# Patient Record
Sex: Male | Born: 1995 | Race: Black or African American | Hispanic: No | Marital: Single | Smoking: Former smoker
Health system: Southern US, Community
[De-identification: ages and names within clinical notes are randomized; demographics above are authoritative.]

## PROBLEM LIST (undated history)

## (undated) DIAGNOSIS — Z973 Presence of spectacles and contact lenses: Secondary | ICD-10-CM

## (undated) DIAGNOSIS — Z8679 Personal history of other diseases of the circulatory system: Secondary | ICD-10-CM

## (undated) DIAGNOSIS — F32A Depression, unspecified: Secondary | ICD-10-CM

## (undated) DIAGNOSIS — F909 Attention-deficit hyperactivity disorder, unspecified type: Secondary | ICD-10-CM

## (undated) DIAGNOSIS — F419 Anxiety disorder, unspecified: Secondary | ICD-10-CM

## (undated) HISTORY — DX: Presence of spectacles and contact lenses: Z97.3

## (undated) HISTORY — DX: Depression, unspecified: F32.A

## (undated) HISTORY — PX: TONSILLECTOMY: SUR1361

## (undated) HISTORY — PX: NO PAST SURGERIES: SHX2092

## (undated) HISTORY — DX: Anxiety disorder, unspecified: F41.9

---
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
## 2015-01-24 ENCOUNTER — Other Ambulatory Visit (INDEPENDENT_AMBULATORY_CARE_PROVIDER_SITE_OTHER): Payer: BLUE CROSS/BLUE SHIELD

## 2015-01-24 DIAGNOSIS — Z23 Encounter for immunization: Secondary | ICD-10-CM | POA: Diagnosis not present

## 2015-03-15 ENCOUNTER — Other Ambulatory Visit: Payer: Self-pay | Admitting: Orthopedic Surgery

## 2015-03-15 DIAGNOSIS — M25531 Pain in right wrist: Secondary | ICD-10-CM

## 2015-03-23 ENCOUNTER — Ambulatory Visit
Admission: RE | Admit: 2015-03-23 | Discharge: 2015-03-23 | Disposition: A | Discharge status: Home / Self Care | Payer: BLUE CROSS/BLUE SHIELD | Source: Ambulatory Visit | Attending: Orthopedic Surgery | Admitting: Orthopedic Surgery

## 2015-03-23 DIAGNOSIS — M25531 Pain in right wrist: Secondary | ICD-10-CM

## 2015-04-21 ENCOUNTER — Ambulatory Visit (HOSPITAL_COMMUNITY)
Admission: EM | Admit: 2015-04-21 | Discharge: 2015-04-21 | Disposition: A | Discharge status: Home / Self Care | Payer: BLUE CROSS/BLUE SHIELD | Attending: Family Medicine | Admitting: Family Medicine

## 2015-04-21 ENCOUNTER — Encounter (HOSPITAL_COMMUNITY): Payer: Self-pay | Admitting: *Deleted

## 2015-04-21 DIAGNOSIS — J302 Other seasonal allergic rhinitis: Secondary | ICD-10-CM

## 2015-04-21 DIAGNOSIS — R040 Epistaxis: Secondary | ICD-10-CM

## 2015-04-21 MED ORDER — IPRATROPIUM BROMIDE 0.06 % NA SOLN: 2.0000 | Freq: Four times a day (QID) | NASAL | Status: DC

## 2015-04-21 MED ORDER — CETIRIZINE HCL 10 MG PO TABS: 10.0000 mg | ORAL_TABLET | Freq: Every day | ORAL | Status: DC

## 2015-04-21 NOTE — ED Notes (Signed)
Pt  Reports    He     Had   A  Nosebleed  About  1  Week  Ago     bleeding  Subsided       Bled  About  2  Hours  Ago  None  Now

## 2015-04-21 NOTE — ED Provider Notes (Signed)
CSN: [id number]     Arrival date & time 04/21/15  1429 History   First MD Initiated Contact with Patient 04/21/15 1507     Chief Complaint  Patient presents with  . Epistaxis   (Consider location/radiation/quality/duration/timing/severity/associated sxs/prior Treatment) Patient is a 20 y.o. male presenting with nosebleeds. The history is provided by the patient.  Epistaxis Location:  L nare Severity:  Mild Duration:  1 week Timing:  Sporadic Progression:  Improving Chronicity:  New Context: weather change   Relieved by:  None tried Worsened by:  Nothing tried Ineffective treatments:  None tried (stopped spontan.) Associated symptoms: congestion   Associated symptoms: no fever   Risk factors: allergies     Past Medical History  Diagnosis Date  . ADHD (attention deficit hyperactivity disorder)   . Wears contact lenses    Past Surgical History  Procedure Laterality Date  . Tonsillectomy    . Adenoidectomy     Family History  Problem Relation Age of Onset  . Cancer Maternal Grandmother     breast  . Heart disease Maternal Grandfather   . Stroke Neg Hx   . Diabetes Other     maternal side   Social History  Substance Use Topics  . Smoking status: Current Some Day Smoker -- 0.25 packs/day for .5 years  . Smokeless tobacco: None     Comment: 1-2 weekly  . Alcohol Use: No    Review of Systems  Constitutional: Negative.  Negative for fever.  HENT: Positive for congestion, nosebleeds and postnasal drip.   Respiratory: Negative.   All other systems reviewed and are negative.   Allergies  Review of patient's allergies indicates no known allergies.  Home Medications   Prior to Admission medications   Not on File   Meds Ordered and Administered this Visit  Medications - No data to display  BP 125/82 mmHg  Pulse 61  Temp(Src) 97.7 F (36.5 C) (Oral)  Resp 16  SpO2 98% No data found.   Physical Exam  Constitutional: He is oriented to person, place, and  time. He appears well-developed and well-nourished. No distress.  HENT:  Nose: Nose normal. No mucosal edema or rhinorrhea. No epistaxis.  Mouth/Throat: Oropharynx is clear and moist.  Neck: Normal range of motion. Neck supple.  Pulmonary/Chest: Effort normal and breath sounds normal.  Lymphadenopathy:    He has no cervical adenopathy.  Neurological: He is alert and oriented to person, place, and time.  Skin: Skin is warm and dry.  Nursing note and vitals reviewed.   ED Course  Procedures (including critical care time)  Labs Review Labs Reviewed - No data to display  Imaging Review No results found.   Visual Acuity Review  Right Eye Distance:   Left Eye Distance:   Bilateral Distance:    Right Eye Near:   Left Eye Near:    Bilateral Near:         MDM  No diagnosis found.  Meds ordered this encounter  Medications  . ipratropium (ATROVENT) 0.06 % nasal spray    Sig: Place 2 sprays into both nostrils 4 (four) times daily.    Dispense:  15 mL    Refill:  1  . cetirizine (ZYRTEC) 10 MG tablet    Sig: Take 1 tablet (10 mg total) by mouth daily. One tab daily for allergies    Dispense:  30 tablet    Refill:  1      Linna HoffJames D Lorene Klimas, MD 04/21/15 1526

## 2015-04-22 ENCOUNTER — Emergency Department (HOSPITAL_COMMUNITY)
Admission: EM | Admit: 2015-04-22 | Discharge: 2015-04-22 | Disposition: A | Discharge status: Home / Self Care | Payer: BLUE CROSS/BLUE SHIELD | Attending: Emergency Medicine | Admitting: Emergency Medicine

## 2015-04-22 ENCOUNTER — Encounter (HOSPITAL_COMMUNITY): Payer: Self-pay | Admitting: *Deleted

## 2015-04-22 DIAGNOSIS — F17200 Nicotine dependence, unspecified, uncomplicated: Secondary | ICD-10-CM | POA: Insufficient documentation

## 2015-04-22 DIAGNOSIS — Z973 Presence of spectacles and contact lenses: Secondary | ICD-10-CM | POA: Insufficient documentation

## 2015-04-22 DIAGNOSIS — R112 Nausea with vomiting, unspecified: Secondary | ICD-10-CM | POA: Insufficient documentation

## 2015-04-22 DIAGNOSIS — F419 Anxiety disorder, unspecified: Secondary | ICD-10-CM | POA: Diagnosis not present

## 2015-04-22 LAB — CBG MONITORING, ED: Glucose-Capillary: 105 mg/dL — ABNORMAL HIGH (ref 65–99)

## 2015-04-22 LAB — CBC WITH DIFFERENTIAL/PLATELET
BASOS ABS: 0 10*3/uL (ref 0.0–0.1)
BASOS PCT: 0 %
EOS ABS: 0.1 10*3/uL (ref 0.0–0.7)
EOS PCT: 1 %
HEMATOCRIT: 46.2 % (ref 39.0–52.0)
Hemoglobin: 16 g/dL (ref 13.0–17.0)
LYMPHS ABS: 0.5 10*3/uL — AB (ref 0.7–4.0)
LYMPHS PCT: 6 %
MCH: 28.4 pg (ref 26.0–34.0)
MCHC: 34.6 g/dL (ref 30.0–36.0)
MCV: 82.1 fL (ref 78.0–100.0)
MONO ABS: 0.8 10*3/uL (ref 0.1–1.0)
MONOS PCT: 9 %
NEUTROS ABS: 6.9 10*3/uL (ref 1.7–7.7)
NEUTROS PCT: 84 %
PLATELETS: 242 10*3/uL (ref 150–400)
RBC: 5.63 MIL/uL (ref 4.22–5.81)
RDW: 12.2 % (ref 11.5–15.5)
WBC: 8.2 10*3/uL (ref 4.0–10.5)

## 2015-04-22 LAB — COMPREHENSIVE METABOLIC PANEL
ALBUMIN: 4.8 g/dL (ref 3.5–5.0)
ALT: 22 U/L (ref 17–63)
AST: 23 U/L (ref 15–41)
Alkaline Phosphatase: 75 U/L (ref 38–126)
Anion gap: 10 (ref 5–15)
BILIRUBIN TOTAL: 0.9 mg/dL (ref 0.3–1.2)
BUN: 18 mg/dL (ref 6–20)
CHLORIDE: 108 mmol/L (ref 101–111)
CO2: 22 mmol/L (ref 22–32)
Calcium: 10.2 mg/dL (ref 8.9–10.3)
Creatinine, Ser: 1.08 mg/dL (ref 0.61–1.24)
GFR calc Af Amer: >60 mL/min (ref >60)
GFR calc non Af Amer: >60 mL/min (ref >60)
GLUCOSE: 129 mg/dL — AB (ref 65–99)
POTASSIUM: 3.7 mmol/L (ref 3.5–5.1)
SODIUM: 140 mmol/L (ref 135–145)
TOTAL PROTEIN: 8.1 g/dL (ref 6.5–8.1)

## 2015-04-22 LAB — RAPID URINE DRUG SCREEN, HOSP PERFORMED
Amphetamines: NOT DETECTED
Barbiturates: NOT DETECTED
Benzodiazepines: NOT DETECTED
Cocaine: NOT DETECTED
Opiates: NOT DETECTED
Tetrahydrocannabinol: NOT DETECTED

## 2015-04-22 LAB — LIPASE, BLOOD: Lipase: 28 U/L (ref 11–51)

## 2015-04-22 MED ORDER — ONDANSETRON 4 MG PO TBDP: 4.0000 mg | ORAL_TABLET | Freq: Three times a day (TID) | ORAL | Status: DC | PRN

## 2015-04-22 MED ORDER — METOCLOPRAMIDE HCL 5 MG/ML IJ SOLN
10.0000 mg | Freq: Once | INTRAMUSCULAR | Status: AC
  Administered 2015-04-22: 10 mg via INTRAVENOUS
  Filled 2015-04-22: qty 2

## 2015-04-22 MED ORDER — SODIUM CHLORIDE 0.9 % IV BOLUS (SEPSIS)
1000.0000 mL | Freq: Once | INTRAVENOUS | Status: AC
  Administered 2015-04-22: 1000 mL via INTRAVENOUS

## 2015-04-22 MED ORDER — LORAZEPAM 2 MG/ML IJ SOLN
0.5000 mg | Freq: Once | INTRAMUSCULAR | Status: AC
  Administered 2015-04-22: 0.5 mg via INTRAVENOUS
  Filled 2015-04-22: qty 1

## 2015-04-22 NOTE — ED Notes (Signed)
Bed: WA03 Expected date:  Expected time:  Means of arrival:  Comments: EMS 

## 2015-04-22 NOTE — ED Notes (Signed)
Per EMS pt coming from home with c/o nausea and vomiting x 2 days, denies pain, fever, diarrhea. EMS reports 2 emesis episodes en route, given 4 mg zofran IM.

## 2015-04-22 NOTE — ED Notes (Signed)
Pt sleeping but made family aware that we need a urine sample from him when pt wakes up.

## 2015-04-22 NOTE — ED Provider Notes (Signed)
CSN: [id number]     Arrival date & time 04/22/15  1041 History   First MD Initiated Contact with Patient 04/22/15 1049     Chief Complaint  Patient presents with  . Nausea  . Emesis     (Consider location/radiation/quality/duration/timing/severity/associated sxs/prior Treatment) HPI Comments: 20 year old male who presents with nausea and vomiting. Patient reports that he began vomiting early this morning and has had multiple episodes since then. He denies any abdominal pain, fevers, cough/cold symptoms, or diarrhea. He was vomiting with EMS and was given 4 mg IM  Zofran. He denies any sick contacts. He denies alcohol, marijuana, or other drug use.  Patient is a 20 y.o. male presenting with vomiting. The history is provided by the patient.  Emesis   Past Medical History  Diagnosis Date  . ADHD (attention deficit hyperactivity disorder)   . Wears contact lenses    Past Surgical History  Procedure Laterality Date  . Tonsillectomy    . Adenoidectomy     Family History  Problem Relation Age of Onset  . Cancer Maternal Grandmother     breast  . Heart disease Maternal Grandfather   . Stroke Neg Hx   . Diabetes Other     maternal side   Social History  Substance Use Topics  . Smoking status: Current Some Day Smoker -- 0.25 packs/day for .5 years  . Smokeless tobacco: None     Comment: 1-2 weekly  . Alcohol Use: No    Review of Systems  Gastrointestinal: Positive for vomiting.   10 Systems reviewed and are negative for acute change except as noted in the HPI.    Allergies  Review of patient's allergies indicates no known allergies.  Home Medications   Prior to Admission medications   Medication Sig Start Date End Date Taking? Authorizing Provider  cetirizine (ZYRTEC) 10 MG tablet Take 1 tablet (10 mg total) by mouth daily. One tab daily for allergies Patient not taking: Reported on 04/22/2015 04/21/15   Linna HoffJames D Kindl, MD  ipratropium (ATROVENT) 0.06 % nasal spray Place 2  sprays into both nostrils 4 (four) times daily. Patient not taking: Reported on 04/22/2015 04/21/15   Linna HoffJames D Kindl, MD  ondansetron (ZOFRAN ODT) 4 MG disintegrating tablet Take 1 tablet (4 mg total) by mouth every 8 (eight) hours as needed for nausea or vomiting. 04/22/15   Ambrose Finlandachel Morgan Nickson [place], MD   BP 116/57 mmHg  Pulse 103  Temp(Src) 98.1 F (36.7 C) (Oral)  Resp 16  SpO2 100% Physical Exam  Constitutional: He is oriented to person, place, and time. He appears well-developed and well-nourished.  Anxious, mildly increased WOB, moving in bed  HENT:  Head: Normocephalic and atraumatic.  dry mucous membranes  Eyes: Conjunctivae are normal. Pupils are equal, round, and reactive to light.  Neck: Neck supple.  Cardiovascular: Normal rate, regular rhythm and normal heart sounds.   No murmur heard. Pulmonary/Chest: Effort normal and breath sounds normal.  Abdominal: Soft. Bowel sounds are normal. He exhibits no distension. There is no tenderness.  Musculoskeletal: He exhibits no edema.  Neurological: He is alert and oriented to person, place, and time.  Fluent speech  Skin: Skin is warm and dry.  Psychiatric:  Anxious, keeps asking for water instead of answering questions  Nursing note and vitals reviewed.   ED Course  Procedures (including critical care time) Labs Review Labs Reviewed  COMPREHENSIVE METABOLIC PANEL - Abnormal; Notable for the following:    Glucose, Bld 129 (*)  All other components within normal limits  CBC WITH DIFFERENTIAL/PLATELET - Abnormal; Notable for the following:    Lymphs Abs 0.5 (*)    All other components within normal limits  CBG MONITORING, ED - Abnormal; Notable for the following:    Glucose-Capillary 105 (*)    All other components within normal limits  LIPASE, BLOOD  URINE RAPID DRUG SCREEN, HOSP PERFORMED    Imaging Review No results found. I have personally reviewed and evaluated these lab results as part of my medical decision-making.    EKG Interpretation None     Medications  LORazepam (ATIVAN) injection 0.5 mg (0.5 mg Intravenous Given 04/22/15 1131)  metoCLOPramide (REGLAN) injection 10 mg (10 mg Intravenous Given 04/22/15 1131)  sodium chloride 0.9 % bolus 1,000 mL (0 mLs Intravenous Stopped 04/22/15 1427)    MDM   Final diagnoses:  Non-intractable vomiting with nausea, vomiting of unspecified type   PT w/ vomiting That he states began early this morning. On exam, he appeared mildly anxious with slightly increased work of breathing and constant movement in the bed although he denied any other symptoms aside from the vomiting. He immediately began vomiting again. Obtained above lab work and gave IV Reglan and IV fluids as well as small dose of Ativan.  Labwork was unremarkable. On reexamination after above medications, the patient was resting comfortably. He continued to deny any abdominal pain and had no abdominal tenderness on repeat examination. He was able to tolerate water and later apple juice with no further episodes of vomiting. His mom does note that she was ill yesterday with vomiting. As he denies any other complaints, I feel acute intra-abdominal process is unlikely. Provided patient with Zofran to use at home and discussed supportive care instructions including continued hydration. Reviewed return precautions including intractable vomiting, abdominal pain, or fever. Patient voiced understanding and was discharged in satisfactory condition.   Laurence Spates, MD 04/22/15 [id number]

## 2015-04-23 ENCOUNTER — Encounter: Payer: Self-pay | Admitting: Family Medicine

## 2015-04-23 ENCOUNTER — Ambulatory Visit (INDEPENDENT_AMBULATORY_CARE_PROVIDER_SITE_OTHER): Payer: BLUE CROSS/BLUE SHIELD | Admitting: Family Medicine

## 2015-04-23 VITALS — BP 120/80 | HR 86 | Temp 98.1°F | Wt 236.4 lb

## 2015-04-23 DIAGNOSIS — Z09 Encounter for follow-up examination after completed treatment for conditions other than malignant neoplasm: Secondary | ICD-10-CM

## 2015-04-23 DIAGNOSIS — R040 Epistaxis: Secondary | ICD-10-CM

## 2015-04-23 NOTE — Progress Notes (Signed)
   Subjective:    Patient ID: Brett Taylor, male    DOB: [birth year], 20 y.o.   MRN: [id number]  HPI Chief Complaint  Patient presents with  . hospital follow-up    hospital follow-up. not having any n/v or nose bleeds   He was seen yesterday in the emergency department for nausea and vomiting and states he was dehydrated. He received IV fluids and nausea medication and went home feeling much improved. He has not had any nausea or vomiting since then. States he feels 100% back to normal.  He also went to Urgent Care the day prior for nose bleeds, states he has allergies and is treating them now with nasal spray and denies nose bleed since then.  States he is here today because his mother made him come in to let us know what is going on.  He has not complaints or concerns today.    Review of Systems  Pertinent positives and negatives in the history of present illness.     Objective:   Physical Exam BP 120/80 mmHg  Pulse 86  Temp(Src) 98.1 F (36.7 C) (Oral)  Wt 236 lb 6.4 oz (107.23 kg)  SpO2 98%  Alert and in no distress. He is well appearing. Bilateral nares red with clear drainage, no bleeding.  No sinus tenderness. Pharyngeal area is normal. Neck is supple without adenopathy or thyromegaly. Cardiac exam shows a regular sinus rhythm without murmurs or gallops. Lungs are clear to auscultation. Abdomen soft and non tender, normal bowel sounds, no guarding, rebound.       Assessment & Plan:  Examination, follow up  Epistaxis  Discussed that his symptoms were most likely related to a viral etiology or possibly food related since it cleared up so quickly and he is back to normal today. Recommend he continue with daily Flonase and nasal saline spray.  He will follow up as needed for recurrent nosebleeds.

## 2015-05-14 ENCOUNTER — Encounter: Payer: Self-pay | Admitting: Medical

## 2015-05-14 ENCOUNTER — Ambulatory Visit (INDEPENDENT_AMBULATORY_CARE_PROVIDER_SITE_OTHER): Payer: BLUE CROSS/BLUE SHIELD | Admitting: Medical

## 2015-05-14 ENCOUNTER — Encounter: Payer: BLUE CROSS/BLUE SHIELD | Admitting: Medical

## 2015-05-14 VITALS — BP 122/80 | HR 98 | Ht 75.75 in | Wt 232.0 lb

## 2015-05-14 DIAGNOSIS — R7309 Other abnormal glucose: Secondary | ICD-10-CM | POA: Diagnosis not present

## 2015-05-14 DIAGNOSIS — Z23 Encounter for immunization: Secondary | ICD-10-CM | POA: Insufficient documentation

## 2015-05-14 DIAGNOSIS — Z0000 Encounter for general adult medical examination without abnormal findings: Secondary | ICD-10-CM | POA: Diagnosis not present

## 2015-05-14 DIAGNOSIS — R739 Hyperglycemia, unspecified: Secondary | ICD-10-CM | POA: Insufficient documentation

## 2015-05-14 DIAGNOSIS — F902 Attention-deficit hyperactivity disorder, combined type: Secondary | ICD-10-CM

## 2015-05-14 DIAGNOSIS — F8081 Childhood onset fluency disorder: Secondary | ICD-10-CM

## 2015-05-14 DIAGNOSIS — E663 Overweight: Secondary | ICD-10-CM | POA: Diagnosis not present

## 2015-05-14 LAB — POCT GLYCOSYLATED HEMOGLOBIN (HGB A1C): HEMOGLOBIN A1C: 5.4

## 2015-05-14 MED ORDER — METHYLPHENIDATE HCL ER (LA) 10 MG PO CP24: 10.0000 mg | ORAL_CAPSULE | Freq: Every day | ORAL | Status: DC

## 2015-05-14 NOTE — Addendum Note (Signed)
Addended by: Kieth BrightlyLAWSON, Jaonna Word M on: 05/14/2015 04:30 PM   Modules accepted: Orders, SmartSet

## 2015-05-14 NOTE — Progress Notes (Signed)
Subjective:   HPI  Brett Taylor is a 20 y.o. male who presents for a complete physical.    Concerns: Wants advice on weight loss  Hx/o ADHD, was diagnosed in childhood elementary school. Was on medication up until middle school.  But lately having issues focusing, and says he has had problems focusing since middle school.   Has difficulty with staying on task.   doesn't recall name of medication he was on prior.  Would like to go back on this.    Reviewed their medical, surgical, family, social, medication, and allergy history and updated chart as appropriate.  Past Medical History  Diagnosis Date  . ADHD (attention deficit hyperactivity disorder)   . Wears glasses     Past Surgical History  Procedure Laterality Date  . Tonsillectomy    . Adenoidectomy      Social History   Social History  . Marital Status: Single    Spouse Name: N/A  . Number of Children: N/A  . Years of Education: N/A   Occupational History  . Not on file.   Social History Main Topics  . Smoking status: Former Smoker -- 0.25 packs/day for .5 years  . Smokeless tobacco: Not on file     Comment: 1-2 weekly  . Alcohol Use: No  . Drug Use: No  . Sexual Activity: Yes   Other Topics Concern  . 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  [employer] for [employer].   No college currently.   Exercise -active on the job, lifts boxes up to 50lb, lots of walking, goes to the gym 3-4 times per week.  No current relatioship    Family History  Problem Relation Age of Onset  . Cancer Maternal Grandmother     breast  . Heart disease Maternal Grandfather   . Stroke Neg Hx   . Diabetes Other     maternal side     Current outpatient prescriptions:  .  methylphenidate (RITALIN LA) 10 MG 24 hr capsule, Take 1 capsule (10 mg total) by mouth daily., Disp: 30 capsule, Rfl: 0  No Known Allergies  Review of  Systems Constitutional: -fever, -chills, -sweats, -unexpected weight change, -decreased appetite, -fatigue Allergy: -sneezing, -itching, -congestion Dermatology: -changing moles, --rash, -lumps ENT: -runny nose, -ear pain, -sore throat, -hoarseness, -sinus pain, -teeth pain, - ringing in ears, -hearing loss, -nosebleeds Cardiology: -chest pain, -palpitations, -swelling, -difficulty breathing when lying flat, -waking up short of breath Respiratory: -cough, -shortness of breath, -difficulty breathing with exercise or exertion, -wheezing, -coughing up blood Gastroenterology: -abdominal pain, -nausea, -vomiting, -diarrhea, -constipation, -blood in stool, -changes in bowel movement, -difficulty swallowing or eating Hematology: -bleeding, -bruising  Musculoskeletal: -joint aches, -muscle aches, -joint swelling, -back pain, -neck pain, -cramping, -changes in gait Ophthalmology: -vision changes, eye redness, itching, discharge Urology: -burning with urination, -difficulty urinating, -blood in urine, -urinary frequency, -urgency, -incontinence Neurology: -headache, -weakness, -tingling, -numbness, -memory loss, -falls, -dizziness Psychology: -depressed mood, -agitation, -sleep problems     Objective:   Physical Exam  BP 122/80 mmHg  Pulse 98  Ht 6' 3.75" (1.924 m)  Wt 232 lb (105.235 kg)  BMI 28.43 kg/m2   Wt Readings from Last 3 Encounters:  05/14/15 232 lb (105.235 kg)  04/23/15 236 lb 6.4 oz (107.23 kg) (98 %*, Z = 2.15)  11/23/14 235 lb (106.595 kg) (98 %*, Z = 2.16)   *  Growth percentiles are based on CDC 2-20 Years data.   General appearance: alert, no distress, WD/WN, AA male Skin:tattoos throughout bilat upper and lower arms, prayer hands left upper arm anterior ly, "born evil" tattoo right upper anterior arm, tattoo anterior neck, other scattered tattoos, no worrisome skin lesions HEENT: normocephalic, conjunctiva/corneas normal, sclerae anicteric, PERRLA, EOMi, nares patent, no  discharge or erythema, pharynx normal Oral cavity: MMM, tongue normal, teeth normal Neck: supple, no lymphadenopathy, no thyromegaly, no masses, normal ROM, no bruits Chest: non tender, normal shape and expansion Heart: RRR, normal S1, S2, no murmurs Lungs: CTA bilaterally, no wheezes, rhonchi, or rales Abdomen: +bs, soft, non tender, non distended, no masses, no hepatomegaly, no splenomegaly, no bruits Back: non tender, normal ROM, no scoliosis Musculoskeletal: upper extremities non tender, no obvious deformity, normal ROM throughout, lower extremities non tender, no obvious deformity, normal ROM throughout Extremities: no edema, no cyanosis, no clubbing Pulses: 2+ symmetric, upper and lower extremities, normal cap refill Neurological: alert, oriented x 3, CN2-12 intact, strength normal upper extremities and lower extremities, sensation normal throughout, DTRs 2+ throughout, no cerebellar signs, gait normal Psychiatric: pleasant, smiles, answer questions appropriately today GU: normal male external genitalia, circumcised, bilat glans of penis at 11 o'clock and 2 o'clock with slightly raised brown papular lesion unchanged since childhood per patient, nontender, no masses, no hernia, no lymphadenopathy Rectal: deferred due to age  30yo, and no indication today   Assessment and Plan :    Encounter Diagnoses  Name Primary?  . Healthy adult on routine physical examination Yes  . Stuttering   . Elevated blood sugar   . Attention deficit hyperactivity disorder (ADHD), combined type   . Overweight   . Need for HPV vaccination     Physical exam - discussed healthy lifestyle, diet, exercise, preventative care, vaccinations, and addressed their concerns.  He refused any labs today after he initially agreed on them.  Stuttering- improved from last visit  Elevated sugar on recent labs but HgbA1C normal  See your dentist yearly for routine dental care including hygiene visits twice yearly. See  your eye doctor yearly for routine vision care.  Vaccinations: Advised yearly flu shot  Counseled on the Human Papilloma virus vaccine.  Vaccine information sheet given.  HPV vaccine given after consent obtained.  Return in 6 months for HPV #3.    discussed weight loss efforts, diet, consider trainer  ADHD - discussed prior diagnosis, will request records.  Begin trial of Methylphenidate.

## 2015-05-26 ENCOUNTER — Telehealth: Payer: Self-pay | Admitting: Medical

## 2015-06-06 ENCOUNTER — Other Ambulatory Visit: Payer: Self-pay | Admitting: Medical

## 2015-06-06 MED ORDER — AMPHETAMINE-DEXTROAMPHET ER 10 MG PO CP24: 10.0000 mg | ORAL_CAPSULE | Freq: Every day | ORAL | Status: DC

## 2015-06-06 NOTE — Telephone Encounter (Signed)
P.A. Ritalin L.A. Denied per insurance needs trial of at least one alternative medication, Adderall XR, Concerta, Dexmethylphenidate HCI ER capsules  Do you want to switch?

## 2015-06-06 NOTE — Telephone Encounter (Signed)
Lets switch to trial of Adderall as insurance wont' pay for the one we chose initially

## 2015-06-07 ENCOUNTER — Telehealth: Payer: Self-pay

## 2015-06-07 NOTE — Telephone Encounter (Signed)
LM for pt to CB. Script for Adderall ready, pt needs to bring in the other script of Ritalin per [place]. Script placed in customer pickup box. Trixie Rude/RLB

## 2015-06-07 NOTE — Telephone Encounter (Signed)
LMTCB

## 2015-06-08 NOTE — Telephone Encounter (Signed)
Pt is aware.  

## 2015-09-01 ENCOUNTER — Encounter (HOSPITAL_COMMUNITY): Payer: Self-pay

## 2015-09-01 ENCOUNTER — Emergency Department (HOSPITAL_COMMUNITY)
Admission: EM | Admit: 2015-09-01 | Discharge: 2015-09-01 | Disposition: A | Discharge status: Home / Self Care | Payer: BLUE CROSS/BLUE SHIELD | Attending: Emergency Medicine | Admitting: Emergency Medicine

## 2015-09-01 DIAGNOSIS — Z5181 Encounter for therapeutic drug level monitoring: Secondary | ICD-10-CM | POA: Diagnosis not present

## 2015-09-01 DIAGNOSIS — Z87891 Personal history of nicotine dependence: Secondary | ICD-10-CM | POA: Insufficient documentation

## 2015-09-01 DIAGNOSIS — F1290 Cannabis use, unspecified, uncomplicated: Secondary | ICD-10-CM | POA: Diagnosis not present

## 2015-09-01 DIAGNOSIS — R55 Syncope and collapse: Secondary | ICD-10-CM | POA: Diagnosis not present

## 2015-09-01 DIAGNOSIS — F902 Attention-deficit hyperactivity disorder, combined type: Secondary | ICD-10-CM | POA: Diagnosis not present

## 2015-09-01 DIAGNOSIS — Z79899 Other long term (current) drug therapy: Secondary | ICD-10-CM | POA: Insufficient documentation

## 2015-09-01 DIAGNOSIS — I10 Essential (primary) hypertension: Secondary | ICD-10-CM | POA: Diagnosis not present

## 2015-09-01 DIAGNOSIS — R030 Elevated blood-pressure reading, without diagnosis of hypertension: Secondary | ICD-10-CM | POA: Diagnosis not present

## 2015-09-01 LAB — CBC WITH DIFFERENTIAL/PLATELET
BASOS ABS: 0 10*3/uL (ref 0.0–0.1)
BASOS PCT: 0 %
EOS ABS: 0.1 10*3/uL (ref 0.0–0.7)
EOS PCT: 3 %
HCT: 41.6 % (ref 39.0–52.0)
Hemoglobin: 14.2 g/dL (ref 13.0–17.0)
Lymphocytes Relative: 23 %
Lymphs Abs: 1 10*3/uL (ref 0.7–4.0)
MCH: 28.7 pg (ref 26.0–34.0)
MCHC: 34.1 g/dL (ref 30.0–36.0)
MCV: 84 fL (ref 78.0–100.0)
MONO ABS: 0.3 10*3/uL (ref 0.1–1.0)
Monocytes Relative: 7 %
Neutro Abs: 2.8 10*3/uL (ref 1.7–7.7)
Neutrophils Relative %: 67 %
PLATELETS: 226 10*3/uL (ref 150–400)
RBC: 4.95 MIL/uL (ref 4.22–5.81)
RDW: 12.7 % (ref 11.5–15.5)
WBC: 4.2 10*3/uL (ref 4.0–10.5)

## 2015-09-01 LAB — COMPREHENSIVE METABOLIC PANEL
ALT: 13 U/L — ABNORMAL LOW (ref 17–63)
AST: 16 U/L (ref 15–41)
Albumin: 4.1 g/dL (ref 3.5–5.0)
Alkaline Phosphatase: 55 U/L (ref 38–126)
Anion gap: 5 (ref 5–15)
BUN: 10 mg/dL (ref 6–20)
CHLORIDE: 108 mmol/L (ref 101–111)
CO2: 27 mmol/L (ref 22–32)
Calcium: 9.4 mg/dL (ref 8.9–10.3)
Creatinine, Ser: 1.13 mg/dL (ref 0.61–1.24)
GFR calc Af Amer: >60 mL/min (ref >60)
Glucose, Bld: 100 mg/dL — ABNORMAL HIGH (ref 65–99)
POTASSIUM: 3.6 mmol/L (ref 3.5–5.1)
SODIUM: 140 mmol/L (ref 135–145)
Total Bilirubin: 0.7 mg/dL (ref 0.3–1.2)
Total Protein: 6.5 g/dL (ref 6.5–8.1)

## 2015-09-01 LAB — RAPID URINE DRUG SCREEN, HOSP PERFORMED
Amphetamines: NOT DETECTED
BARBITURATES: NOT DETECTED
Benzodiazepines: NOT DETECTED
COCAINE: NOT DETECTED
OPIATES: NOT DETECTED
TETRAHYDROCANNABINOL: POSITIVE — AB

## 2015-09-01 MED ORDER — SODIUM CHLORIDE 0.9 % IV BOLUS (SEPSIS)
1000.0000 mL | Freq: Once | INTRAVENOUS | Status: AC
  Administered 2015-09-01: 1000 mL via INTRAVENOUS

## 2015-09-01 NOTE — ED Provider Notes (Signed)
[facility]-EMERGENCY DEPT Provider Note   CSN: [id number] Arrival date & time: 09/01/15  1542     History   Chief Complaint Chief Complaint  Patient presents with  . Drug Problem    HPI Brett Taylor is a 20 y.o. male.  Patient presents today after a syncopal episode.  History is not completely clear.  However, he states that he smoked some marijuana earlier today that he believes was laced with something.  He states that he used a [employer] rights manager[employer] dealer.  He reports that after smoking the marijuana he began to feel panicked.  His family reports that he then passed out in the front lawn and EMS was therefore called.  Family reports that the syncopal episode happened just prior to arrival.  EMS reported that the patient was hypertensive and tachycardic when she arrived to the scene, but that resolved upon arrival to the ED.  He denies any chest pain, SOB, headache, vision changes, numbness, tingling, any pain, or any other symptoms.  He denies use of alcohol or any other recreational drugs.  He denies any cardiac history.  Denies any FH of cardiac disease or unexpected death of unknown cause at a young age.        Past Medical History:  Diagnosis Date  . ADHD (attention deficit hyperactivity disorder)   . Wears glasses     Patient Active Problem List   Diagnosis Date Noted  . Healthy adult on routine physical examination 05/14/2015  . 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 Elevated blood sugar 05/14/2015  . Attention deficit hyperactivity disorder (ADHD), combined type 05/14/2015  . Overweight 05/14/2015  . Need for HPV vaccination 05/14/2015    Past Surgical History:  Procedure Laterality Date  . ADENOIDECTOMY    . TONSILLECTOMY         Home Medications    Prior to Admission medications   Medication Sig Start Date End Date Taking? Authorizing Provider  amphetamine-dextroamphetamine (ADDERALL XR) 10 MG 24 hr capsule Take 1 capsule (10 mg total) by mouth daily. 06/06/15   Jac Canavanavid S Tysinger,  PA-C    Family History Family History  Problem Relation Age of Onset  . Cancer Maternal Grandmother     breast  . Heart disease Maternal Grandfather   . Diabetes Other     maternal side  . Stroke Neg Hx     Social History Social History  Substance Use Topics  . Smoking status: Former Smoker    Packs/day: 0.25    Years: 0.50  . Smokeless tobacco: Not on file     Comment: 1-2 weekly  . Alcohol use No     Allergies   Review of patient's allergies indicates no known allergies.   Review of Systems Review of Systems  All other systems reviewed and are negative.    Physical Exam Updated Vital Signs BP 126/88 (BP Location: Right Arm)   Pulse 89   Temp 98.7 F (37.1 C) (Oral)   Resp 18   Ht 6\' 5"  (1.956 m)   Wt 104.3 kg   SpO2 96%   BMI 27.27 kg/m   Physical Exam  Constitutional: He appears well-developed and well-nourished.  HENT:  Head: Normocephalic and atraumatic.  Mouth/Throat: Oropharynx is clear and moist.  Eyes: EOM are normal. Pupils are equal, round, and reactive to light.  Neck: Normal range of motion. Neck supple.  Cardiovascular: Normal rate and regular rhythm.   Pulmonary/Chest: Effort normal and breath sounds normal.  Abdominal: Soft. Bowel sounds are normal. He  exhibits no distension and no mass. There is no tenderness. There is no rebound and no guarding. No hernia.  Musculoskeletal: Normal range of motion.  Neurological: He is alert. He has normal strength. No cranial nerve deficit. Coordination and gait normal. GCS eye subscore is 4. GCS verbal subscore is 5. GCS motor subscore is 6.  Skin: Skin is warm and dry.  Psychiatric: He has a normal mood and affect.  Nursing note and vitals reviewed.    ED Treatments / Results  Labs (all labs ordered are listed, but only abnormal results are displayed) Labs Reviewed  CBC WITH DIFFERENTIAL/PLATELET  COMPREHENSIVE METABOLIC PANEL  URINE RAPID DRUG SCREEN, HOSP PERFORMED    EKG  EKG  Interpretation  Date/Time:  Saturday September 01 2015 16:20:53 EDT Ventricular Rate:  84 PR Interval:    QRS Duration: 70 QT Interval:  331 QTC Calculation: 392 R Axis:   73 Text Interpretation:  Sinus rhythm ST elev, probable normal early repol pattern Artifact in lead(s) I II aVR Confirmed by [facility] MD, PEDRO ([id number]) on 09/01/2015 5:56:35 PM       Radiology No results found.  Procedures Procedures (including critical care time)  Medications Ordered in ED Medications  sodium chloride 0.9 % bolus 1,000 mL (1,000 mLs Intravenous New Bag/Given 09/01/15 1651)     Initial Impression / Assessment and Plan / ED Course  I have reviewed the triage vital signs and the nursing notes.  Pertinent labs & imaging results that were available during my care of the patient were reviewed by me and considered in my medical decision making (see chart for details).  Clinical Course   Patient presents today after a syncopal episode.  He reports smoking marijuana prior to the syncopal episode and suspects that it was laced with something.  Patient alert and orientated at the time of my evaluation.  VSS.  No signs of head trauma.  No ischemic changes on EKG.  Labs unremarkable.  Normal neurological exam.  He denies any CP or SOB.  Feel that the patient is stable for discharge.  Feel that the syncope may be related to recreational drug use.  Return precautions given.    Final Clinical Impressions(s) / ED Diagnoses   Final diagnoses:  None    New Prescriptions New Prescriptions   No medications on file     Santiago GladHeather Lorelei Heikkila, PA-C 09/04/15 2006    Nira ConnPedro Eduardo Cardama, MD 09/05/15 1027

## 2015-09-01 NOTE — Discharge Instructions (Signed)
It is recommended to refrain from using marijuana.

## 2015-09-01 NOTE — ED Notes (Signed)
He is awake, alert and happily visiting with his father.  He is drinking his second glass of water per his request.
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
## 2015-09-01 NOTE — ED Notes (Signed)
Bed: RESB Expected date: 09/01/15 Expected time: 3:39 PM Means of arrival:  Comments: Anxiety,

## 2015-09-07 ENCOUNTER — Encounter (HOSPITAL_COMMUNITY): Payer: Self-pay | Admitting: Vascular Surgery

## 2015-09-07 ENCOUNTER — Emergency Department (HOSPITAL_COMMUNITY): Payer: BLUE CROSS/BLUE SHIELD

## 2015-09-07 DIAGNOSIS — Z5321 Procedure and treatment not carried out due to patient leaving prior to being seen by health care provider: Secondary | ICD-10-CM | POA: Insufficient documentation

## 2015-09-07 DIAGNOSIS — F419 Anxiety disorder, unspecified: Secondary | ICD-10-CM | POA: Insufficient documentation

## 2015-09-07 DIAGNOSIS — R079 Chest pain, unspecified: Secondary | ICD-10-CM | POA: Diagnosis not present

## 2015-09-07 DIAGNOSIS — F909 Attention-deficit hyperactivity disorder, unspecified type: Secondary | ICD-10-CM | POA: Insufficient documentation

## 2015-09-07 DIAGNOSIS — F17210 Nicotine dependence, cigarettes, uncomplicated: Secondary | ICD-10-CM | POA: Insufficient documentation

## 2015-09-07 DIAGNOSIS — R0602 Shortness of breath: Secondary | ICD-10-CM | POA: Diagnosis not present

## 2015-09-07 LAB — CBC
HCT: 46.2 % (ref 39.0–52.0)
HEMOGLOBIN: 15.5 g/dL (ref 13.0–17.0)
MCH: 29.6 pg (ref 26.0–34.0)
MCHC: 33.5 g/dL (ref 30.0–36.0)
MCV: 88.2 fL (ref 78.0–100.0)
Platelets: 243 10*3/uL (ref 150–400)
RBC: 5.24 MIL/uL (ref 4.22–5.81)
RDW: 12.6 % (ref 11.5–15.5)
WBC: 5.3 10*3/uL (ref 4.0–10.5)

## 2015-09-07 LAB — BASIC METABOLIC PANEL
Anion gap: 9 (ref 5–15)
BUN: 11 mg/dL (ref 6–20)
CHLORIDE: 107 mmol/L (ref 101–111)
CO2: 23 mmol/L (ref 22–32)
CREATININE: 1.18 mg/dL (ref 0.61–1.24)
Calcium: 10 mg/dL (ref 8.9–10.3)
GFR calc non Af Amer: >60 mL/min (ref >60)
GLUCOSE: 115 mg/dL — AB (ref 65–99)
Potassium: 3.6 mmol/L (ref 3.5–5.1)
Sodium: 139 mmol/L (ref 135–145)

## 2015-09-07 LAB — I-STAT TROPONIN, ED: TROPONIN I, POC: 0 ng/mL (ref 0.00–0.08)

## 2015-09-07 NOTE — ED Triage Notes (Addendum)
Pt reports to the ED for eval of SOB, shaking, and lightheadedness. Pt has hx of anxiety episodes. Unsure if this is an anxiety attack or not. Pt still currently having the symptoms. States he is having "a little" chest pain. Pt A&Ox4, resp e/u, and skin warm and dry.   Admits to smoking THC prior to these symptoms starting.

## 2015-09-08 ENCOUNTER — Emergency Department (HOSPITAL_COMMUNITY)
Admission: EM | Admit: 2015-09-08 | Discharge: 2015-09-08 | Disposition: A | Discharge status: Home / Self Care | Payer: BLUE CROSS/BLUE SHIELD | Attending: Dermatology | Admitting: Dermatology

## 2015-11-28 ENCOUNTER — Telehealth: Payer: Self-pay

## 2015-11-28 NOTE — Telephone Encounter (Signed)
Adderall script at front desk dated 06/06/2015 has not been picked up. Script has been destroyed and placed in shred bin./ RLB

## 2016-01-31 ENCOUNTER — Ambulatory Visit: Payer: BLUE CROSS/BLUE SHIELD | Admitting: Family

## 2016-03-25 ENCOUNTER — Encounter (HOSPITAL_COMMUNITY): Payer: Self-pay | Admitting: General Practice

## 2016-03-25 DIAGNOSIS — S49191A Other physeal fracture of lower end of humerus, right arm, initial encounter for closed fracture: Secondary | ICD-10-CM | POA: Diagnosis not present

## 2016-03-25 DIAGNOSIS — F17200 Nicotine dependence, unspecified, uncomplicated: Secondary | ICD-10-CM | POA: Diagnosis present

## 2016-03-25 DIAGNOSIS — R9431 Abnormal electrocardiogram [ECG] [EKG]: Secondary | ICD-10-CM | POA: Diagnosis not present

## 2016-03-25 DIAGNOSIS — F909 Attention-deficit hyperactivity disorder, unspecified type: Secondary | ICD-10-CM | POA: Diagnosis not present

## 2016-03-25 DIAGNOSIS — Z5320 Procedure and treatment not carried out because of patient's decision for unspecified reasons: Secondary | ICD-10-CM | POA: Diagnosis present

## 2016-03-25 DIAGNOSIS — S42401D Unspecified fracture of lower end of right humerus, subsequent encounter for fracture with routine healing: Secondary | ICD-10-CM | POA: Diagnosis not present

## 2016-03-25 DIAGNOSIS — S6991XA Unspecified injury of right wrist, hand and finger(s), initial encounter: Secondary | ICD-10-CM | POA: Diagnosis not present

## 2016-03-25 DIAGNOSIS — Z01818 Encounter for other preprocedural examination: Secondary | ICD-10-CM | POA: Diagnosis not present

## 2016-03-25 DIAGNOSIS — S42351A Displaced comminuted fracture of shaft of humerus, right arm, initial encounter for closed fracture: Secondary | ICD-10-CM | POA: Diagnosis present

## 2016-03-25 DIAGNOSIS — S42491A Other displaced fracture of lower end of right humerus, initial encounter for closed fracture: Secondary | ICD-10-CM | POA: Diagnosis not present

## 2016-03-25 DIAGNOSIS — Z79899 Other long term (current) drug therapy: Secondary | ICD-10-CM

## 2016-03-25 DIAGNOSIS — F902 Attention-deficit hyperactivity disorder, combined type: Secondary | ICD-10-CM | POA: Diagnosis present

## 2016-03-25 DIAGNOSIS — E559 Vitamin D deficiency, unspecified: Secondary | ICD-10-CM | POA: Diagnosis present

## 2016-03-25 DIAGNOSIS — W1839XA Other fall on same level, initial encounter: Secondary | ICD-10-CM | POA: Diagnosis present

## 2016-03-25 DIAGNOSIS — Y9361 Activity, american tackle football: Secondary | ICD-10-CM

## 2016-03-25 DIAGNOSIS — S42401A Unspecified fracture of lower end of right humerus, initial encounter for closed fracture: Secondary | ICD-10-CM | POA: Diagnosis not present

## 2016-03-26 DIAGNOSIS — S42401A Unspecified fracture of lower end of right humerus, initial encounter for closed fracture: Secondary | ICD-10-CM | POA: Diagnosis not present

## 2016-03-26 DIAGNOSIS — S42351A Displaced comminuted fracture of shaft of humerus, right arm, initial encounter for closed fracture: Secondary | ICD-10-CM | POA: Diagnosis not present

## 2016-03-26 DIAGNOSIS — S42491A Other displaced fracture of lower end of right humerus, initial encounter for closed fracture: Secondary | ICD-10-CM | POA: Diagnosis not present

## 2016-03-27 ENCOUNTER — Encounter (HOSPITAL_COMMUNITY): Payer: Self-pay | Admitting: *Deleted

## 2016-03-27 DIAGNOSIS — S6991XA Unspecified injury of right wrist, hand and finger(s), initial encounter: Secondary | ICD-10-CM | POA: Diagnosis not present

## 2016-03-27 DIAGNOSIS — S42401D Unspecified fracture of lower end of right humerus, subsequent encounter for fracture with routine healing: Secondary | ICD-10-CM | POA: Diagnosis not present

## 2016-03-27 DIAGNOSIS — S42351A Displaced comminuted fracture of shaft of humerus, right arm, initial encounter for closed fracture: Secondary | ICD-10-CM | POA: Diagnosis not present

## 2016-03-27 DIAGNOSIS — F909 Attention-deficit hyperactivity disorder, unspecified type: Secondary | ICD-10-CM | POA: Diagnosis not present

## 2016-03-28 ENCOUNTER — Encounter (HOSPITAL_COMMUNITY): Payer: Self-pay | Admitting: Orthopedic Surgery

## 2016-03-30 ENCOUNTER — Encounter (HOSPITAL_COMMUNITY): Payer: Self-pay | Admitting: Emergency Medicine

## 2016-03-30 DIAGNOSIS — F17210 Nicotine dependence, cigarettes, uncomplicated: Secondary | ICD-10-CM | POA: Insufficient documentation

## 2016-03-30 DIAGNOSIS — Z79899 Other long term (current) drug therapy: Secondary | ICD-10-CM | POA: Insufficient documentation

## 2016-03-30 DIAGNOSIS — Z4800 Encounter for change or removal of nonsurgical wound dressing: Secondary | ICD-10-CM | POA: Diagnosis not present

## 2016-03-30 DIAGNOSIS — Z4801 Encounter for change or removal of surgical wound dressing: Secondary | ICD-10-CM | POA: Insufficient documentation

## 2016-03-30 DIAGNOSIS — J45909 Unspecified asthma, uncomplicated: Secondary | ICD-10-CM | POA: Insufficient documentation

## 2016-03-30 DIAGNOSIS — F909 Attention-deficit hyperactivity disorder, unspecified type: Secondary | ICD-10-CM | POA: Insufficient documentation

## 2016-03-31 ENCOUNTER — Encounter (HOSPITAL_COMMUNITY): Payer: Self-pay | Admitting: Orthopedic Surgery
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
## 2016-07-18 ENCOUNTER — Encounter: Payer: Self-pay | Admitting: Family

## 2016-07-18 ENCOUNTER — Ambulatory Visit (INDEPENDENT_AMBULATORY_CARE_PROVIDER_SITE_OTHER): Payer: BLUE CROSS/BLUE SHIELD | Admitting: Family

## 2016-07-18 VITALS — BP 118/80 | HR 78 | Temp 98.0°F | Resp 16 | Ht 77.0 in | Wt 242.0 lb

## 2016-07-18 DIAGNOSIS — Z0000 Encounter for general adult medical examination without abnormal findings: Secondary | ICD-10-CM | POA: Diagnosis not present

## 2016-07-18 NOTE — Patient Instructions (Signed)
Thank you for choosing [facility].  SUMMARY AND INSTRUCTIONS:  Please continue your healthy lifestyle behaviors and choices.   Please complete your blood work when fasting.  Follow up will be 1 year or sooner depending upon your blood work.  Medication:  Your prescription(s) have been submitted to your pharmacy or been printed and provided for you. Please take as directed and contact our office if you believe you are having problem(s) with the medication(s) or have any questions.  Follow up:  If your symptoms worsen or fail to improve, please contact our office for further instruction, or in case of emergency go directly to the emergency room at the closest medical facility.    Health Maintenance, Male A healthy lifestyle and preventive care is important for your health and wellness. Ask your health care provider about what schedule of regular examinations is right for you. What should I know about weight and diet? Eat a Healthy Diet  Eat plenty of vegetables, fruits, whole grains, low-fat dairy products, and lean protein.  Do not eat a lot of foods high in solid fats, added sugars, or salt.  Maintain a Healthy Weight Regular exercise can help you achieve or maintain a healthy weight. You should:  Do at least 150 minutes of exercise each week. The exercise should increase your heart rate and make you sweat (moderate-intensity exercise).  Do strength-training exercises at least twice a week.  Watch Your Levels of Cholesterol and Blood Lipids  Have your blood tested for lipids and cholesterol every 5 years starting at 21 years of age. If you are at high risk for heart disease, you should start having your blood tested when you are 21 years old. You may need to have your cholesterol levels checked more often if: ? Your lipid or cholesterol levels are high. ? You are older than 21 years of age. ? You are at high risk for heart disease.  What should I know about cancer  screening? Many types of cancers can be detected early and may often be prevented. Lung Cancer  You should be screened every year for lung cancer if: ? You are a current smoker who has smoked for at least 30 years. ? You are a former smoker who has quit within the past 15 years.  Talk to your health care provider about your screening options, when you should start screening, and how often you should be screened.  Colorectal Cancer  Routine colorectal cancer screening usually begins at 21 years of age and should be repeated every 5-10 years until you are 21 years old. You may need to be screened more often if early forms of precancerous polyps or small growths are found. Your health care provider may recommend screening at an earlier age if you have risk factors for colon cancer.  Your health care provider may recommend using home test kits to check for hidden blood in the stool.  A small camera at the end of a tube can be used to examine your colon (sigmoidoscopy or colonoscopy). This checks for the earliest forms of colorectal cancer.  Prostate and Testicular Cancer  Depending on your age and overall health, your health care provider may do certain tests to screen for prostate and testicular cancer.  Talk to your health care provider about any symptoms or concerns you have about testicular or prostate cancer.  Skin Cancer  Check your skin from head to toe regularly.  Tell your health care provider about any new moles or  changes in moles, especially if: ? There is a change in a mole's size, shape, or color. ? You have a mole that is larger than a pencil eraser.  Always use sunscreen. Apply sunscreen liberally and repeat throughout the day.  Protect yourself by wearing long sleeves, pants, a wide-brimmed hat, and sunglasses when outside.  What should I know about heart disease, diabetes, and high blood pressure?  If you are 718-21 years of age, have your blood pressure checked  every 3-5 years. If you are 21 years of age or older, have your blood pressure checked every year. You should have your blood pressure measured twice-once when you are at a hospital or clinic, and once when you are not at a hospital or clinic. Record the average of the two measurements. To check your blood pressure when you are not at a hospital or clinic, you can use: ? An automated blood pressure machine at a pharmacy. ? A home blood pressure monitor.  Talk to your health care provider about your target blood pressure.  If you are between 1045-21 years old, ask your health care provider if you should take aspirin to prevent heart disease.  Have regular diabetes screenings by checking your fasting blood sugar level. ? If you are at a normal weight and have a low risk for diabetes, have this test once every three years after the age of 21. ? If you are overweight and have a high risk for diabetes, consider being tested at a younger age or more often.  A one-time screening for abdominal aortic aneurysm (AAA) by ultrasound is recommended for men aged 65-75 years who are current or former smokers. What should I know about preventing infection? Hepatitis B If you have a higher risk for hepatitis B, you should be screened for this virus. Talk with your health care provider to find out if you are at risk for hepatitis B infection. Hepatitis C Blood testing is recommended for:  Everyone born from [birth year] through 1965.  Anyone with known risk factors for hepatitis C.  Sexually Transmitted Diseases (STDs)  You should be screened each year for STDs including gonorrhea and chlamydia if: ? You are sexually active and are younger than 21 years of age. ? You are older than 21 years of age and your health care provider tells you that you are at risk for this type of infection. ? Your sexual activity has changed since you were last screened and you are at an increased risk for chlamydia or gonorrhea. Ask your  health care provider if you are at risk.  Talk with your health care provider about whether you are at high risk of being infected with HIV. Your health care provider may recommend a prescription medicine to help prevent HIV infection.  What else can I do?  Schedule regular health, dental, and eye exams.  Stay current with your vaccines (immunizations).  Do not use any tobacco products, such as cigarettes, chewing tobacco, and e-cigarettes. If you need help quitting, ask your health care provider.  Limit alcohol intake to no more than 2 drinks per day. One drink equals 12 ounces of beer, 5 ounces of wine, or 1 ounces of hard liquor.  Do not use street drugs.  Do not share needles.  Ask your health care provider for help if you need support or information about quitting drugs.  Tell your health care provider if you often feel depressed.  Tell your health care provider if you have ever  been abused or do not feel safe at home. This information is not intended to replace advice given to you by your health care provider. Make sure you discuss any questions you have with your health care provider. Document Released: 06/28/2007 Document Revised: 08/29/2015 Document Reviewed: 10/03/2014 Elsevier Interactive Patient Education  [employer].

## 2016-07-18 NOTE — Progress Notes (Signed)
Subjective:    Patient ID: Brett Taylor, male    DOB: [birth year], 21 y.o.   MRN: [id number]  Chief Complaint  Patient presents with  . Establish Care    States no concerns, general check, not fasting    HPI:  Brett Taylor is a 21 y.o. male who presents today for an annual wellness visit.   1) Health Maintenance -   Diet - Averages about 3 meals per day consisting of a regular diet; Caffeine intake 7-10 cups per day.  Exercise - 2-3x per week; play basketball   2) Preventative Exams / Immunizations:  Dental -- Due for exam  Vision -- Due for exam   Health Maintenance  Topic Date Due  . HIV Screening  05/01/2010  . INFLUENZA VACCINE  08/13/2016  . TETANUS/TDAP  08/16/2024    Immunization History  Administered Date(s) Administered  . DTaP 06/25/1995, 09/16/1995, 12/16/1995, 06/16/1996, 04/27/2000  . HPV 9-valent 05/14/2015  . HPV Quadrivalent 02/03/2012  . Hepatitis A 01/30/2006, 02/01/2007  . Hepatitis B 1995-05-17, 06/25/1995, 12/16/1995  . HiB (PRP-OMP) 06/25/1995, 09/16/1995, 12/16/1995, 06/16/1996  . IPV 06/25/1995, 09/16/1995, 06/16/1996, 04/27/2000  . Influenza,inj,Quad PF,36+ Mos 01/24/2015  . Influenza-Unspecified 02/01/2007, 03/05/2009, 02/03/2012  . MMR 06/16/1996, 04/27/2000  . Meningococcal Conjugate 02/01/2007  . Td 02/01/2007  . Tdap 02/01/2007, 08/21/2011, 08/17/2014  . Varicella 04/05/2001, 01/30/2006     Allergies  Allergen Reactions  . No Known Allergies      No outpatient prescriptions prior to visit.   No facility-administered medications prior to visit.      Past Medical History:  Diagnosis Date  . ADHD (attention deficit hyperactivity disorder)   . Asthma   . Humeral distal fracture 03/25/2016   "playing ball"  . Marijuana abuse 03/28/2016  . Migraine    "almost qd" (03/25/2016)  . Nicotine dependence 03/28/2016  . Seizure, grand mal ([place])    "last one was a couple years ago" (03/25/2016)  . Vitamin D deficiency  03/28/2016  . Wears glasses      Past Surgical History:  Procedure Laterality Date  . NO PAST SURGERIES    . TONSILLECTOMY       Family History  Problem Relation Age of Onset  . Hypertension Mother   . Cancer Maternal Grandmother        breast  . Heart disease Maternal Grandfather   . Diabetes Other        maternal side  . Stroke Neg Hx      Social History   Social History  . Marital status: Single    Spouse name: N/A  . Number of children: N/A  . Years of education: 66   Occupational History  . Not on file.   Social History Main Topics  . Smoking status: Former Smoker    Packs/day: 0.25    Years: 4.00    Types: Cigarettes  . Smokeless tobacco: Never Used     Comment: 03/25/2016 "smoked a black right before I came in"  . Alcohol use 0.0 oz/week  . Drug use: No  . Sexual activity: Yes   Other Topics Concern  . 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  [employer] school for high school.   No college currently.   Exercise -active on the job, lifts boxes up to 50lb, lots of walking, goes to the gym 3-4 times per week.  No current relatioship      Review of Systems  Constitutional: Denies fever, chills, fatigue, or significant weight gain/loss. HENT: Head: Denies headache or neck pain Ears: Denies changes in hearing, ringing in ears, earache, drainage Nose: Denies discharge, stuffiness, itching, nosebleed, sinus pain Throat: Denies sore throat, hoarseness, dry mouth, sores, thrush Eyes: Denies loss/changes in vision, pain, redness, blurry/double vision, flashing lights Cardiovascular: Denies chest pain/discomfort, tightness, palpitations, shortness of breath with activity, difficulty lying down, swelling, sudden awakening with shortness of breath Respiratory: Denies shortness of breath, cough, sputum production, wheezing Gastrointestinal: Denies dysphasia, heartburn,  change in appetite, nausea, change in bowel habits, rectal bleeding, constipation, diarrhea, yellow skin or eyes Genitourinary: Denies frequency, urgency, burning/pain, blood in urine, incontinence, change in urinary strength. Musculoskeletal: Denies muscle/joint pain, stiffness, back pain, redness or swelling of joints, trauma Skin: Denies rashes, lumps, itching, dryness, color changes, or hair/nail changes Neurological: Denies dizziness, fainting, seizures, weakness, numbness, tingling, tremor Psychiatric - Denies nervousness, stress, depression or memory loss Endocrine: Denies heat or cold intolerance, sweating, frequent urination, excessive thirst, changes in appetite Hematologic: Denies ease of bruising or bleeding     Objective:     BP 118/80 (BP Location: Left Arm, Patient Position: Sitting, Cuff Size: Large)   Pulse 78   Temp 98 F (36.7 C) (Oral)   Resp 16   Ht '6\' 5"'  (1.956 m)   Wt 242 lb (109.8 kg)   SpO2 97%   BMI 28.70 kg/m  Nursing note and vital signs reviewed.  Physical Exam  Constitutional: He is oriented to person, place, and time. He appears well-developed and well-nourished.  HENT:  Head: Normocephalic.  Right Ear: Hearing, tympanic membrane, external ear and ear canal normal.  Left Ear: Hearing, tympanic membrane, external ear and ear canal normal.  Nose: Nose normal.  Mouth/Throat: Uvula is midline, oropharynx is clear and moist and mucous membranes are normal.  Eyes: Conjunctivae and EOM are normal. Pupils are equal, round, and reactive to light.  Neck: Neck supple. No JVD present. No tracheal deviation present. No thyromegaly present.  Cardiovascular: Normal rate, regular rhythm, normal heart sounds and intact distal pulses.   Pulmonary/Chest: Effort normal and breath sounds normal.  Abdominal: Soft. Bowel sounds are normal. He exhibits no distension and no mass. There is no tenderness. There is no rebound and no guarding.  Musculoskeletal: Normal range of  motion. He exhibits no edema or tenderness.  Lymphadenopathy:    He has no cervical adenopathy.  Neurological: He is alert and oriented to person, place, and time. He has normal reflexes. No cranial nerve deficit. He exhibits normal muscle tone. Coordination normal.  Skin: Skin is warm and dry.  Psychiatric: He has a normal mood and affect. His behavior is normal. Judgment and thought content normal.       Assessment & Plan:   Problem List Items Addressed This Visit      Other   Healthy adult on routine physical examination - Primary    1) Anticipatory Guidance: Discussed importance of wearing a seatbelt while driving and not texting while driving; changing batteries in smoke detector at least once annually; wearing suntan lotion when outside; eating a balanced and moderate diet; getting physical activity at least 30 minutes per day.  2) Immunizations / Screenings / Labs:  All immunizations are up-to-date per recommendations. Due for dental and vision exam encouraged to be completed independently. All other screenings are up-to-date per recommendations. Obtain CBC, CMET, and lipid profile.    Overall  well exam with risk factors for cardiovascular disease being minimal. Recommend cutting down on caffeine intake. Continue healthy lifestyle behaviors and choices. Follow up prevention exam in 1 year and follow up office visit pending blood work if needed.            Mr. Youngblood does not currently have medications on file.   Follow-up: Return in about 1 year (around 07/18/2017), or if symptoms worsen or fail to improve.   Mauricio Po, FNP

## 2016-07-18 NOTE — Assessment & Plan Note (Signed)
1) Anticipatory Guidance: Discussed importance of wearing a seatbelt while driving and not texting while driving; changing batteries in smoke detector at least once annually; wearing suntan lotion when outside; eating a balanced and moderate diet; getting physical activity at least 30 minutes per day.  2) Immunizations / Screenings / Labs:  All immunizations are up-to-date per recommendations. Due for dental and vision exam encouraged to be completed independently. All other screenings are up-to-date per recommendations. Obtain CBC, CMET, and lipid profile.    Overall well exam with risk factors for cardiovascular disease being minimal. Recommend cutting down on caffeine intake. Continue healthy lifestyle behaviors and choices. Follow up prevention exam in 1 year and follow up office visit pending blood work if needed.

## 2016-08-04 ENCOUNTER — Ambulatory Visit (HOSPITAL_COMMUNITY): Payer: BLUE CROSS/BLUE SHIELD

## 2016-08-04 ENCOUNTER — Encounter (HOSPITAL_COMMUNITY): Payer: Self-pay | Admitting: Emergency Medicine

## 2016-08-04 ENCOUNTER — Emergency Department (HOSPITAL_COMMUNITY)
Admission: EM | Admit: 2016-08-04 | Discharge: 2016-08-04 | Discharge status: Against Medical Advice | Payer: BLUE CROSS/BLUE SHIELD | Attending: Emergency Medicine | Admitting: Emergency Medicine

## 2016-08-04 ENCOUNTER — Emergency Department (HOSPITAL_COMMUNITY): Payer: BLUE CROSS/BLUE SHIELD

## 2016-08-04 DIAGNOSIS — R51 Headache: Secondary | ICD-10-CM | POA: Diagnosis not present

## 2016-08-04 DIAGNOSIS — Z87891 Personal history of nicotine dependence: Secondary | ICD-10-CM | POA: Diagnosis not present

## 2016-08-04 DIAGNOSIS — J45909 Unspecified asthma, uncomplicated: Secondary | ICD-10-CM | POA: Diagnosis not present

## 2016-08-04 DIAGNOSIS — F902 Attention-deficit hyperactivity disorder, combined type: Secondary | ICD-10-CM | POA: Diagnosis not present

## 2016-08-04 DIAGNOSIS — R519 Headache, unspecified: Secondary | ICD-10-CM

## 2016-08-04 LAB — CBC WITH DIFFERENTIAL/PLATELET
BASOS ABS: 0 10*3/uL (ref 0.0–0.1)
BASOS PCT: 0 %
EOS ABS: 0.1 10*3/uL (ref 0.0–0.7)
Eosinophils Relative: 2 %
HEMATOCRIT: 43 % (ref 39.0–52.0)
HEMOGLOBIN: 15.3 g/dL (ref 13.0–17.0)
Lymphocytes Relative: 53 %
Lymphs Abs: 3.7 10*3/uL (ref 0.7–4.0)
MCH: 28 pg (ref 26.0–34.0)
MCHC: 35.6 g/dL (ref 30.0–36.0)
MCV: 78.8 fL (ref 78.0–100.0)
Monocytes Absolute: 0.6 10*3/uL (ref 0.1–1.0)
Monocytes Relative: 8 %
NEUTROS PCT: 37 %
Neutro Abs: 2.6 10*3/uL (ref 1.7–7.7)
Platelets: 217 10*3/uL (ref 150–400)
RBC: 5.46 MIL/uL (ref 4.22–5.81)
RDW: 13.2 % (ref 11.5–15.5)
WBC: 7 10*3/uL (ref 4.0–10.5)

## 2016-08-04 LAB — COMPREHENSIVE METABOLIC PANEL
ALK PHOS: 57 U/L (ref 38–126)
ALT: 86 U/L — AB (ref 17–63)
AST: 44 U/L — ABNORMAL HIGH (ref 15–41)
Albumin: 4 g/dL (ref 3.5–5.0)
Anion gap: 10 (ref 5–15)
BUN: 21 mg/dL — ABNORMAL HIGH (ref 6–20)
CALCIUM: 9.6 mg/dL (ref 8.9–10.3)
CO2: 24 mmol/L (ref 22–32)
CREATININE: 1.27 mg/dL — AB (ref 0.61–1.24)
Chloride: 106 mmol/L (ref 101–111)
GFR calc non Af Amer: >60 mL/min (ref >60)
Glucose, Bld: 124 mg/dL — ABNORMAL HIGH (ref 65–99)
Potassium: 3.8 mmol/L (ref 3.5–5.1)
SODIUM: 140 mmol/L (ref 135–145)
Total Bilirubin: 0.6 mg/dL (ref 0.3–1.2)
Total Protein: 7.1 g/dL (ref 6.5–8.1)

## 2016-08-04 LAB — CBG MONITORING, ED: Glucose-Capillary: 94 mg/dL (ref 65–99)

## 2016-08-04 MED ORDER — DEXAMETHASONE SODIUM PHOSPHATE 10 MG/ML IJ SOLN
10.0000 mg | Freq: Once | INTRAMUSCULAR | Status: AC
  Administered 2016-08-04: 10 mg via INTRAVENOUS
  Filled 2016-08-04: qty 1

## 2016-08-04 MED ORDER — SODIUM CHLORIDE 0.9 % IV BOLUS (SEPSIS)
1000.0000 mL | Freq: Once | INTRAVENOUS | Status: AC
  Administered 2016-08-04: 1000 mL via INTRAVENOUS

## 2016-08-04 MED ORDER — METOCLOPRAMIDE HCL 5 MG/ML IJ SOLN
10.0000 mg | Freq: Once | INTRAMUSCULAR | Status: AC
  Administered 2016-08-04: 10 mg via INTRAVENOUS
  Filled 2016-08-04: qty 2

## 2016-08-04 MED ORDER — DIPHENHYDRAMINE HCL 50 MG/ML IJ SOLN: 25.0000 mg | Freq: Once | INTRAMUSCULAR | Status: DC

## 2016-08-04 MED ORDER — DIPHENHYDRAMINE HCL 25 MG PO CAPS
25.0000 mg | ORAL_CAPSULE | Freq: Once | ORAL | Status: AC
  Administered 2016-08-04: 25 mg via ORAL
  Filled 2016-08-04: qty 1

## 2016-08-04 MED ORDER — DIPHENHYDRAMINE HCL 50 MG/ML IJ SOLN
25.0000 mg | Freq: Once | INTRAMUSCULAR | Status: AC
  Administered 2016-08-04: 25 mg via INTRAVENOUS
  Filled 2016-08-04: qty 1

## 2016-08-04 MED ORDER — IOPAMIDOL (ISOVUE-370) INJECTION 76%
100.0000 mL | Freq: Once | INTRAVENOUS | Status: AC | PRN
  Administered 2016-08-04: 100 mL via INTRAVENOUS

## 2016-08-04 MED ORDER — ONDANSETRON 4 MG PO TBDP
4.0000 mg | ORAL_TABLET | Freq: Once | ORAL | Status: AC
  Administered 2016-08-04: 4 mg via ORAL
  Filled 2016-08-04: qty 1

## 2016-08-04 MED ORDER — ONDANSETRON 4 MG PO TBDP: 4.0000 mg | ORAL_TABLET | Freq: Three times a day (TID) | ORAL | 0 refills | Status: AC | PRN

## 2016-08-04 MED ORDER — IOPAMIDOL (ISOVUE-370) INJECTION 76%
INTRAVENOUS | Status: AC
  Filled 2016-08-04: qty 100

## 2016-08-04 NOTE — Discharge Instructions (Signed)
Take Zofran as needed for nausea. Used your usual headache medications. Return to the ED immediately if your headache returns, or if you have any concerning neurological symptoms or persisting vomiting. Follow-up with your primary care physician this week for reevaluation.

## 2016-08-04 NOTE — ED Triage Notes (Addendum)
Pt c/o stabbing severe anterior headache onset today when it woke him from sleep, went to sleep feeling normal at 0400. Associated symptoms: nausea, shakiness, chills, light-headedness, generalized bilateral weakness. No photophobia, phonophobia, vision changes, nuchal rigidity, recent illness. No hx of similar headache. Pt reports feeling unusually thirsty and dehydrated. Keeps asking for water, shaking, confused. Has difficulty recalling his medical history, medications he takes, and recent history. Reports confusion is new today. Pt does not recall recent head trauma or seizure, but states he is unsure. CN II-XII intact to exam. Negative Romberg. Gait normal, no limb ataxia.

## 2016-08-04 NOTE — ED Notes (Signed)
Pt asking to leave AMA. Provider called to bedside.

## 2016-08-04 NOTE — ED Notes (Signed)
Family/mother at beside. Urging patient to comply. Pt repeats "I am fine."

## 2016-08-04 NOTE — ED Notes (Signed)
Per CT pt was unable to complete ct scan pt states "I am hot all over." Pt began to pull IV out.

## 2016-08-04 NOTE — ED Provider Notes (Signed)
Medical screening examination/treatment/procedure(s) were conducted as a shared visit with non-physician practitioner(s) and myself.  I personally evaluated the patient during the encounter.  21 year old male with an abrupt onset of a headache he describes as being worse in any these ever felt before. This started around 4:00 this morning has gradually subsided since that time. He's had one episode of vomiting. No neuro changes. No fevers. No neck pain or back pain. Sounds like there was some reports of confusion but the time my examination the patient is alert and oriented. He has no neuro deficits. I did not ambulate him yet but did give him a by mouth challenge and he did not vomit with that. He has had a head CT and a CTA within a few hours of onset of headaches if these are negative I feel like a subarachnoid hemorrhage is unlikely. Patient is afebrile without meningismus making meningitis unlikely as well. I suspect this is a non-emergent headache and we will treat accordingly if CT ok.  After headache cocktail patient developed akathisia's and wanted to leave. I reassured her this was normal and he took more Benadryl however he felt like he was better and his headache was improved he did not want stay any longer. Patient was discharged AGAINST MEDICAL ADVICE.   Marily MemosMesner, Jaylene Schrom, MD 08/04/16 [phone]

## 2016-08-04 NOTE — ED Notes (Signed)
Pt verbalized understanding of discharge instructions and denies any further questions at this time.   

## 2016-08-04 NOTE — ED Notes (Signed)
Patient transported to CT 

## 2016-08-04 NOTE — ED Provider Notes (Signed)
[facility]-EMERGENCY DEPT Provider Note   CSN: [id number] Arrival date & time: 08/04/16  0725     History   Chief Complaint Chief Complaint  Patient presents with  . Headache  . Nausea  . Altered Mental Status    HPI Brett Taylor is a 21 y.o. male with history of ADHD, asthma, migraines, and seizure who presents today with complaint of acute onset, progressively improved headache. He states he awoke at around 6:30 AM with severe stabbing, throbbing headache localized to the middle of the 4 head without radiation. Associated symptoms include nausea, lightheadedness, generalized weakness, subjective fevers and chills.  Symptoms persisted until presentation to the ED and have now improved to 3/10 in pain. He states this feels differently than his usual headaches, and states that is the most severe headache he has ever had. Denies trauma or falls. No observed seizure-like activity. He denies numbness, tingling, difficulty walking, vomiting, abdominal pain, chest pain, or shortness of breath. He denies neck pain or neck stiffness. No IV drug use or recent alcohol use.  The history is provided by the patient.    Past Medical History:  Diagnosis Date  . ADHD (attention deficit hyperactivity disorder)   . Asthma   . Humeral distal fracture 03/25/2016   "playing ball"  . Marijuana abuse 03/28/2016  . Migraine    "almost qd" (03/25/2016)  . Nicotine dependence 03/28/2016  . Seizure, grand mal (HCC)    "last one was a couple years ago" (03/25/2016)  . Vitamin D deficiency 03/28/2016  . Wears glasses     Patient Active Problem List   Diagnosis Date Noted  . Healthy adult on routine physical examination 05/14/2015  . 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 Elevated blood sugar 05/14/2015  . Attention deficit hyperactivity disorder (ADHD), combined type 05/14/2015  . Overweight 05/14/2015  . Need for HPV vaccination 05/14/2015    Past Surgical History:  Procedure Laterality Date  . NO PAST SURGERIES      . TONSILLECTOMY         Home Medications    Prior to Admission medications   Medication Sig Start Date End Date Taking? Authorizing Provider  ondansetron (ZOFRAN ODT) 4 MG disintegrating tablet Take 1 tablet (4 mg total) by mouth every 8 (eight) hours as needed for nausea or vomiting. 08/04/16 08/07/16  Jeanie Sewer, PA-C    Family History Family History  Problem Relation Age of Onset  . Hypertension Mother   . Cancer Maternal Grandmother        breast  . Heart disease Maternal Grandfather   . Diabetes Other        maternal side  . Stroke Neg Hx     Social History Social History  Substance Use Topics  . Smoking status: Former Smoker    Packs/day: 0.25    Years: 4.00    Types: Cigarettes  . Smokeless tobacco: Never Used     Comment: 03/25/2016 "smoked a black right before I came in"  . Alcohol use 0.0 oz/week     Allergies   Patient has no known allergies.   Review of Systems Review of Systems  Constitutional: Positive for chills and fever.  Eyes: Negative for photophobia and visual disturbance.  Neurological: Positive for weakness, light-headedness and headaches. Negative for numbness.     Physical Exam Updated Vital Signs BP 129/85   Pulse 86   Temp (!) 97.4 F (36.3 C) (Oral)   Resp 17   SpO2 99%   Physical Exam  Constitutional: He is oriented to person, place, and time. He appears well-developed and well-nourished. No distress.  Resting in bed, keeps eyes closed  HENT:  Head: Normocephalic and atraumatic.  Right Ear: External ear normal.  Left Ear: External ear normal.  Mouth/Throat: Oropharynx is clear and moist.  Eyes: Pupils are equal, round, and reactive to light. Conjunctivae and EOM are normal. Right eye exhibits no discharge. Left eye exhibits no discharge.  Neck: Normal range of motion. Neck supple. No JVD present. No tracheal deviation present.  No nuchal rigidity, kernig's and brudzinski's signs absent  Cardiovascular: Normal rate,  regular rhythm, normal heart sounds and intact distal pulses.   2+ radial and DP/PT pulses bl, negative Homan's bl   Pulmonary/Chest: Effort normal and breath sounds normal. No respiratory distress. He has no wheezes.  Abdominal: Soft. Bowel sounds are normal. He exhibits no distension. There is no tenderness.  Musculoskeletal: Normal range of motion. He exhibits no edema, tenderness or deformity.  No midline spine TTP, no paraspinal muscle tenderness, no deformity, crepitus, or step-off noted. Normal ROM of extremities and moves extremities spontaneously but moves slowly. 5/5 strength of BUE and BLE major muscles groups when encouraged.    Lymphadenopathy:    He has no cervical adenopathy.  Neurological: He is alert and oriented to person, place, and time.  Keeping eyes closed, but easily arousable and will follow commands. Slow to answer questions at times. Fluent speech, no facial droop, sensation intact to soft touch of extremities. No pronator drift, cranial nerves III through XII tested and intact. Normal gait, normal finger to nose.   Skin: Skin is warm and dry. No erythema.  Psychiatric: He has a normal mood and affect. His behavior is normal.  Nursing note and vitals reviewed.    ED Treatments / Results  Labs (all labs ordered are listed, but only abnormal results are displayed) Labs Reviewed  COMPREHENSIVE METABOLIC PANEL - Abnormal; Notable for the following:       Result Value   Glucose, Bld 124 (*)    BUN 21 (*)    Creatinine, Ser 1.27 (*)    AST 44 (*)    ALT 86 (*)    All other components within normal limits  CBC WITH DIFFERENTIAL/PLATELET  URINALYSIS, ROUTINE W REFLEX MICROSCOPIC  CBG MONITORING, ED    EKG  EKG Interpretation None       Radiology Ct Head Wo Contrast  Result Date: 08/04/2016 CLINICAL DATA:  Frontal headache upon awakening approximately 1 hour ago, nausea and vomiting, weakness. EXAM: CT HEAD WITHOUT CONTRAST TECHNIQUE: Contiguous axial  images were obtained from the base of the skull through the vertex without intravenous contrast. COMPARISON:  None. FINDINGS: Brain: All areas of the brain demonstrate normal gray-white matter attenuation. There is no mass, hemorrhage, edema or other evidence of acute parenchymal abnormality. No extra-axial hemorrhage. No hydrocephalus. Vascular: No hyperdense vessel or unexpected calcification. Skull: Normal. Negative for fracture or focal lesion. Sinuses/Orbits: No acute finding. Other: None. IMPRESSION: Negative head CT.  No intracranial mass, hemorrhage or edema. Electronically Signed   By: Bary Richard M.D.   On: 08/04/2016 08:42    Procedures Procedures (including critical care time)  Medications Ordered in ED Medications  iopamidol (ISOVUE-370) 76 % injection (not administered)  ondansetron (ZOFRAN-ODT) disintegrating tablet 4 mg (4 mg Oral Given 08/04/16 0906)  metoCLOPramide (REGLAN) injection 10 mg (10 mg Intravenous Given 08/04/16 0905)  sodium chloride 0.9 % bolus 1,000 mL (0 mLs Intravenous Stopped 08/04/16 1002)  dexamethasone (DECADRON) injection 10 mg (10 mg Intravenous Given 08/04/16 0906)  diphenhydrAMINE (BENADRYL) injection 25 mg (25 mg Intravenous Given 08/04/16 0906)  iopamidol (ISOVUE-370) 76 % injection 100 mL (100 mLs Intravenous Contrast Given 08/04/16 0921)  diphenhydrAMINE (BENADRYL) capsule 25 mg (25 mg Oral Given 08/04/16 1010)     Initial Impression / Assessment and Plan / ED Course  I have reviewed the triage vital signs and the nursing notes.  Pertinent labs & imaging results that were available during my care of the patient were reviewed by me and considered in my medical decision making (see chart for details).     Patient with complaints of a severe headache which has significantly improved since onset at 4 AM. Afebrile, vital signs are stable. No focal neurological deficits. Initially slow to answer questions but alert and oriented with resolution of the  swelling in the ED. No leukocytosis, no significant electrolyte abnormalities. No nuchal rigidity or fever that would be concerning for meningitis. CT head without contrast shows no acute abnormalities. While in the ED, patient became restless and is requesting discharge home. Suspect his complaint of being hot likely akathisia secondary to Reglan administration. Discussed that Benadryl can be helpful to the patient's symptoms and would allow us to obtain the CTA. Patient refuses and continues to repeat "I am fine, this is just a migraine and I want to go home ". I believe patient has capacity to make this decision at this time. Discussed the risks of leaving without completing workup including possible death. Offered alternatives in an attempt to have patient stay and complete outstanding imaging. Patient refuses and is leaving AMA. Encouraged patient to have a low threshold for return to the ED. Recommend follow-up with primary care for reevaluation as well. Patient verbalized understanding of and agreement with this. Patient seen and evaluated by Dr. Clayborne DanaMesner.    Final Clinical Impressions(s) / ED Diagnoses   Final diagnoses:  Bad headache    New Prescriptions Discharge Medication List as of 08/04/2016  9:47 AM    START taking these medications   Details  ondansetron (ZOFRAN ODT) 4 MG disintegrating tablet Take 1 tablet (4 mg total) by mouth every 8 (eight) hours as needed for nausea or vomiting., Starting Mon 08/04/2016, Until Thu 08/07/2016, Print         Luevenia MaxinFawze, KodiakMina A, PA-C 08/04/16 1033    Mesner, Barbara CowerJason, MD 08/05/16 1555

## 2016-09-30 ENCOUNTER — Ambulatory Visit: Payer: BLUE CROSS/BLUE SHIELD | Admitting: Nurse Practitioner

## 2016-10-01 ENCOUNTER — Ambulatory Visit: Payer: BLUE CROSS/BLUE SHIELD | Admitting: Nurse Practitioner

## 2016-11-06 ENCOUNTER — Encounter: Payer: Self-pay | Admitting: Orthopedic Surgery

## 2017-04-28 ENCOUNTER — Telehealth: Payer: Self-pay | Admitting: Internal Medicine

## 2017-04-28 NOTE — Telephone Encounter (Signed)
Patient is requesting to transfer to Dr Yetta BarreJones. He was a previous patient of Marcos EkeGreg Cal[facility].  Dr. Yetta BarreJones, is this okay with you?

## 2017-04-28 NOTE — Telephone Encounter (Signed)
Tried to call patient. No answer and no voice mail 

## 2017-04-28 NOTE — Telephone Encounter (Signed)
Yes, I will see him.

## 2017-04-28 NOTE — Telephone Encounter (Signed)
Copied from CRM [phone]. Topic: Appointment Scheduling - Prior Auth Required for Appointment >> Apr 28, 2017  9:00 AM Brett Taylor, Brett Taylor wrote: No appointment has been scheduled. [facility] Sanda Lingerhomas Jones or a male provider. Per scheduling protocol, this appointment requires a prior authorization prior to scheduling.  Route to department's PEC pool.

## 2017-05-27 ENCOUNTER — Encounter: Payer: BLUE CROSS/BLUE SHIELD | Admitting: Family Medicine

## 2017-07-02 ENCOUNTER — Encounter: Payer: Self-pay | Admitting: Family Medicine

## 2017-07-03 ENCOUNTER — Encounter: Payer: Self-pay | Admitting: Family Medicine

## 2017-07-03 ENCOUNTER — Ambulatory Visit (INDEPENDENT_AMBULATORY_CARE_PROVIDER_SITE_OTHER): Payer: BLUE CROSS/BLUE SHIELD | Admitting: Family Medicine

## 2017-07-03 VITALS — BP 118/70 | HR 86 | Temp 98.7°F | Ht 76.0 in | Wt 260.0 lb

## 2017-07-03 DIAGNOSIS — Z13220 Encounter for screening for lipoid disorders: Secondary | ICD-10-CM

## 2017-07-03 DIAGNOSIS — E6609 Other obesity due to excess calories: Secondary | ICD-10-CM

## 2017-07-03 DIAGNOSIS — Z6831 Body mass index (BMI) 31.0-31.9, adult: Secondary | ICD-10-CM | POA: Diagnosis not present

## 2017-07-03 DIAGNOSIS — Z0000 Encounter for general adult medical examination without abnormal findings: Secondary | ICD-10-CM | POA: Diagnosis not present

## 2017-07-03 NOTE — Patient Instructions (Signed)

## 2017-07-03 NOTE — Assessment & Plan Note (Addendum)
Well Adult Orders Placed This Encounter  Procedures  . Comp Met (CMET)    Standing Status:   Future    Standing Expiration Date:   07/04/2018  . Lipid Profile    Standing Status:   Future    Standing Expiration Date:   07/04/2018  . TSH    Standing Status:   Future    Standing Expiration Date:   07/04/2018  . CBC    Standing Status:   Future    Standing Expiration Date:   07/04/2018   Immunizations: UTD Screenings: None due at this time Anticipatory guidance/Risk factor reduction:  See AVS

## 2017-07-03 NOTE — Progress Notes (Signed)
Brett Taylor - 22 y.o. male MRN [id number]  Date of birth: 1995/10/24  Subjective Chief Complaint  Patient presents with  . Establish Care    CPE not fasting.    HPI Brett Taylor is a 22 y.o. male here today to establish care with new pcp.  He has a history of ADHD however is not on any medicaiton at this time.  He has no concerns today.  He would like to have labs but is not fasting at this time.  He believes he is up to date on immunizations.  Review of Systems  Constitutional: Negative for chills, fever, malaise/fatigue and weight loss.  HENT: Negative for congestion, ear pain and sore throat.   Eyes: Negative for blurred vision, double vision and pain.  Respiratory: Negative for cough and shortness of breath.   Cardiovascular: Negative for chest pain and palpitations.  Gastrointestinal: Negative for abdominal pain, blood in stool, constipation, heartburn and nausea.  Genitourinary: Negative for dysuria and urgency.  Musculoskeletal: Negative for joint pain and myalgias.  Neurological: Negative for dizziness and headaches.  Endo/Heme/Allergies: Does not bruise/bleed easily.  Psychiatric/Behavioral: Negative for depression. The patient is not nervous/anxious and does not have insomnia.     No Known Allergies  Past Medical History:  Diagnosis Date  . ADHD (attention deficit hyperactivity disorder)   . Wears glasses     Past Surgical History:  Procedure Laterality Date  . NO PAST SURGERIES    . TONSILLECTOMY      Social History   Socioeconomic History  . Marital status: Single    Spouse name: Not on file  . Number of children: Not on file  . Years of education: 52  . Highest education level: Not on file  Occupational History  . Not on file  Social Needs  . Financial resource strain: Not on file  . Food insecurity:    Worry: Not on file    Inability: Not on file  . Transportation needs:    Medical: Not on file    Non-medical: Not on file  Tobacco Use  .  Smoking status: Former Smoker    Packs/day: 0.25    Years: 4.00    Pack years: 1.00    Types: Cigarettes  . Smokeless tobacco: Never Used  . Tobacco comment: 03/25/2016 "smoked a black right before I came in"  Substance and Sexual Activity  . Alcohol use: Yes    Alcohol/week: 0.0 oz  . Drug use: No  . Sexual activity: Yes  Lifestyle  . Physical activity:    Days per week: Not on file    Minutes per session: Not on file  . Stress: Not on file  Relationships  . Social connections:    Talks on phone: Not on file    Gets together: Not on file    Attends religious service: Not on file    Active member of club or organization: Not on file    Attends meetings of clubs or organizations: Not on file    Relationship status: Not on file  Other Topics Concern  . 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  [employer] school for high school.   No college currently.   Exercise -active on the job, lifts boxes up to 50lb, lots of walking, goes to the gym 3-4 times per week.  No current relatioship    Family History  Problem Relation Age of Onset  . Hypertension Mother   . Cancer Maternal Grandmother        breast  . Heart disease Maternal Grandfather   . Diabetes Other        maternal side  . Stroke Neg Hx     Health Maintenance  Topic Date Due  . HIV Screening  05/01/2010  . INFLUENZA VACCINE  08/13/2017  . TETANUS/TDAP  08/16/2024    ----------------------------------------------------------------------------------------------------------------------------------------------------------------------------------------------------------------- Physical Exam BP 118/70 (BP Location: Left Arm, Patient Position: Sitting, Cuff Size: Large)   Pulse 86   Temp 98.7 F (37.1 C) (Oral)   Ht '6\' 4"'  (1.93 m)   Wt 260 lb (117.9 kg)   SpO2 96%   BMI 31.65 kg/m   Physical Exam  Constitutional: He is  oriented to person, place, and time. He appears well-nourished. No distress.  HENT:  Head: Normocephalic and atraumatic.  Mouth/Throat: Oropharynx is clear and moist.  Eyes: No scleral icterus.  Neck: Neck supple. No thyromegaly present.  Cardiovascular: Normal rate, regular rhythm, normal heart sounds and intact distal pulses.  Pulmonary/Chest: Effort normal and breath sounds normal.  Abdominal: Soft. Bowel sounds are normal. He exhibits no distension. There is no tenderness.  Musculoskeletal: Normal range of motion. He exhibits no tenderness.  Lymphadenopathy:    He has no cervical adenopathy.  Neurological: He is alert and oriented to person, place, and time. No cranial nerve deficit.  Skin: Skin is warm and dry.  Psychiatric: He has a normal mood and affect. His behavior is normal.    ------------------------------------------------------------------------------------------------------------------------------------------------------------------------------------------------------------------- Assessment and Plan  Well adult exam Well Adult Orders Placed This Encounter  Procedures  . Comp Met (CMET)    Standing Status:   Future    Standing Expiration Date:   07/04/2018  . Lipid Profile    Standing Status:   Future    Standing Expiration Date:   07/04/2018  . TSH    Standing Status:   Future    Standing Expiration Date:   07/04/2018  . CBC    Standing Status:   Future    Standing Expiration Date:   07/04/2018   Immunizations: UTD Screenings: None due at this time Anticipatory guidance/Risk factor reduction:  See AVS

## 2017-07-09 ENCOUNTER — Other Ambulatory Visit: Payer: BLUE CROSS/BLUE SHIELD

## 2017-07-13 ENCOUNTER — Emergency Department (HOSPITAL_COMMUNITY)
Admission: EM | Admit: 2017-07-13 | Discharge: 2017-07-13 | Disposition: A | Discharge status: Home / Self Care | Payer: BLUE CROSS/BLUE SHIELD | Attending: Emergency Medicine | Admitting: Emergency Medicine

## 2017-07-13 ENCOUNTER — Other Ambulatory Visit: Payer: Self-pay

## 2017-07-13 ENCOUNTER — Emergency Department (HOSPITAL_COMMUNITY): Payer: BLUE CROSS/BLUE SHIELD

## 2017-07-13 ENCOUNTER — Encounter (HOSPITAL_COMMUNITY): Payer: Self-pay | Admitting: *Deleted

## 2017-07-13 DIAGNOSIS — R079 Chest pain, unspecified: Secondary | ICD-10-CM | POA: Insufficient documentation

## 2017-07-13 DIAGNOSIS — Z5321 Procedure and treatment not carried out due to patient leaving prior to being seen by health care provider: Secondary | ICD-10-CM | POA: Diagnosis not present

## 2017-07-13 HISTORY — DX: Personal history of other diseases of the circulatory system: Z86.79

## 2017-07-13 LAB — BASIC METABOLIC PANEL
ANION GAP: 10 (ref 5–15)
BUN: 6 mg/dL (ref 6–20)
CHLORIDE: 105 mmol/L (ref 98–111)
CO2: 28 mmol/L (ref 22–32)
Calcium: 9.8 mg/dL (ref 8.9–10.3)
Creatinine, Ser: 1.2 mg/dL (ref 0.61–1.24)
Glucose, Bld: 99 mg/dL (ref 70–99)
Potassium: 4.2 mmol/L (ref 3.5–5.1)
SODIUM: 143 mmol/L (ref 135–145)

## 2017-07-13 LAB — CBC
HEMATOCRIT: 46.7 % (ref 39.0–52.0)
HEMOGLOBIN: 14.7 g/dL (ref 13.0–17.0)
MCH: 27.1 pg (ref 26.0–34.0)
MCHC: 31.5 g/dL (ref 30.0–36.0)
MCV: 86 fL (ref 78.0–100.0)
Platelets: 429 10*3/uL — ABNORMAL HIGH (ref 150–400)
RBC: 5.43 MIL/uL (ref 4.22–5.81)
RDW: 12.6 % (ref 11.5–15.5)
WBC: 6.4 10*3/uL (ref 4.0–10.5)

## 2017-07-13 LAB — I-STAT TROPONIN, ED: Troponin i, poc: 0 ng/mL (ref 0.00–0.08)

## 2017-07-13 NOTE — ED Notes (Signed)
Called for room x2 with no response. 

## 2017-07-13 NOTE — ED Notes (Signed)
Called PT to go to room no response 1x

## 2017-07-13 NOTE — ED Triage Notes (Signed)
Pt states acute onset R sided chest pain that made his R arm go numb.  Pt was sitting in car when s/s started and symptoms lasted for 25 min.  Presently s/s free.

## 2017-07-13 NOTE — ED Notes (Signed)
Pt called for room x3 with no response 

## 2017-07-15 NOTE — ED Notes (Signed)
Follow up call made  Pt feeling better  Will return if needed  07/15/17 1223  s Josiah Wojtaszek rn

## 2017-07-17 ENCOUNTER — Ambulatory Visit (INDEPENDENT_AMBULATORY_CARE_PROVIDER_SITE_OTHER): Payer: BLUE CROSS/BLUE SHIELD | Admitting: Family Medicine

## 2017-07-17 ENCOUNTER — Encounter: Payer: Self-pay | Admitting: Family Medicine

## 2017-07-17 VITALS — BP 130/90 | HR 91 | Temp 98.0°F | Ht 76.0 in | Wt 255.0 lb

## 2017-07-17 DIAGNOSIS — R079 Chest pain, unspecified: Secondary | ICD-10-CM | POA: Diagnosis not present

## 2017-07-17 DIAGNOSIS — Z13220 Encounter for screening for lipoid disorders: Secondary | ICD-10-CM

## 2017-07-17 DIAGNOSIS — E6609 Other obesity due to excess calories: Secondary | ICD-10-CM | POA: Diagnosis not present

## 2017-07-17 DIAGNOSIS — Z0000 Encounter for general adult medical examination without abnormal findings: Secondary | ICD-10-CM | POA: Diagnosis not present

## 2017-07-17 DIAGNOSIS — Z6831 Body mass index (BMI) 31.0-31.9, adult: Secondary | ICD-10-CM

## 2017-07-17 LAB — COMPREHENSIVE METABOLIC PANEL
ALT: 12 U/L (ref 0–53)
AST: 14 U/L (ref 0–37)
Albumin: 4.3 g/dL (ref 3.5–5.2)
Alkaline Phosphatase: 60 U/L (ref 39–117)
BUN: 9 mg/dL (ref 6–23)
CO2: 27 meq/L (ref 19–32)
Calcium: 9.7 mg/dL (ref 8.4–10.5)
Chloride: 105 mEq/L (ref 96–112)
Creatinine, Ser: 1.19 mg/dL (ref 0.40–1.50)
GFR: 98.12 mL/min (ref >60.00)
GLUCOSE: 100 mg/dL — AB (ref 70–99)
Potassium: 3.6 mEq/L (ref 3.5–5.1)
SODIUM: 141 meq/L (ref 135–145)
Total Bilirubin: 0.4 mg/dL (ref 0.2–1.2)
Total Protein: 7.5 g/dL (ref 6.0–8.3)

## 2017-07-17 LAB — LIPID PANEL
CHOL/HDL RATIO: 4
Cholesterol: 136 mg/dL (ref 0–200)
HDL: 31.4 mg/dL — AB (ref >39.00)
LDL Cholesterol: 88 mg/dL (ref 0–99)
NONHDL: 104.99
Triglycerides: 87 mg/dL (ref 0.0–149.0)
VLDL: 17.4 mg/dL (ref 0.0–40.0)

## 2017-07-17 LAB — CBC
HEMATOCRIT: 40.7 % (ref 39.0–52.0)
HEMOGLOBIN: 13.5 g/dL (ref 13.0–17.0)
MCHC: 33.2 g/dL (ref 30.0–36.0)
MCV: 84.9 fl (ref 78.0–100.0)
Platelets: 324 10*3/uL (ref 150.0–400.0)
RBC: 4.79 Mil/uL (ref 4.22–5.81)
RDW: 13.6 % (ref 11.5–15.5)
WBC: 5 10*3/uL (ref 4.0–10.5)

## 2017-07-17 LAB — TSH: TSH: 0.72 u[IU]/mL (ref 0.35–4.50)

## 2017-07-17 NOTE — Assessment & Plan Note (Signed)
Chest pain seems to be related to GERD Recommend trial of ranitidine, dietary changes to help with prevention He will let me know if this is not helping.

## 2017-07-17 NOTE — Patient Instructions (Signed)
If you have chest pain with reflux symptoms try taking zantac (ranitidine)  Heartburn Heartburn is a type of pain or discomfort that can happen in the throat or chest. It is often described as a burning pain. It may also cause a bad taste in the mouth. Heartburn may feel worse when you lie down or bend over. It may be caused by stomach contents that move back up (reflux) into the tube that connects the mouth with the stomach (esophagus). Follow these instructions at home: Take these actions to lessen your discomfort and to help avoid problems. Diet  Follow a diet as told by your doctor. You may need to avoid foods and drinks such as: ? Coffee and tea (with or without caffeine). ? Drinks that contain alcohol. ? Energy drinks and sports drinks. ? Carbonated drinks or sodas. ? Chocolate and cocoa. ? Peppermint and mint flavorings. ? Garlic and onions. ? Horseradish. ? Spicy and acidic foods, such as peppers, chili powder, curry powder, vinegar, hot sauces, and BBQ sauce. ? Citrus fruit juices and citrus fruits, such as oranges, lemons, and limes. ? Tomato-based foods, such as red sauce, chili, salsa, and pizza with red sauce. ? Fried and fatty foods, such as donuts, french fries, potato chips, and high-fat dressings. ? High-fat meats, such as hot dogs, rib eye steak, sausage, ham, and bacon. ? High-fat dairy items, such as whole milk, butter, and cream cheese.  Eat small meals often. Avoid eating large meals.  Avoid drinking large amounts of liquid with your meals.  Avoid eating meals during the 2-3 hours before bedtime.  Avoid lying down right after you eat.  Do not exercise right after you eat. General instructions  Pay attention to any changes in your symptoms.  Take over-the-counter and prescription medicines only as told by your doctor. Do not take aspirin, ibuprofen, or other NSAIDs unless your doctor says it is okay.  Do not use any tobacco products, including cigarettes,  chewing tobacco, and e-cigarettes. If you need help quitting, ask your doctor.  Wear loose clothes. Do not wear anything tight around your waist.  Raise (elevate) the head of your bed about 6 inches (15 cm).  Try to lower your stress. If you need help doing this, ask your doctor.  If you are overweight, lose an amount of weight that is healthy for you. Ask your doctor about a safe weight loss goal.  Keep all follow-up visits as told by your doctor. This is important. Contact a doctor if:  You have new symptoms.  You lose weight and you do not know why it is happening.  You have trouble swallowing, or it hurts to swallow.  You have wheezing or a cough that keeps happening.  Your symptoms do not get better with treatment.  You have heartburn often for more than two weeks. Get help right away if:  You have pain in your arms, neck, jaw, teeth, or back.  You feel sweaty, dizzy, or light-headed.  You have chest pain or shortness of breath.  You throw up (vomit) and your throw up looks like blood or coffee grounds.  Your poop (stool) is bloody or black. This information is not intended to replace advice given to you by your health care provider. Make sure you discuss any questions you have with your health care provider. Document Released: 09/11/2010 Document Revised: 06/07/2015 Document Reviewed: 04/26/2014 Elsevier Interactive Patient Education  [employer]2018 [employer]

## 2017-07-17 NOTE — Progress Notes (Signed)
Brett Blazernest U [employer] - 22 y.o. male MRN [id number]  Date of birth: June 30, 1995  Subjective Chief Complaint  Patient presents with  . Hospitalization Follow-up    hospital f/u from chest pains    HPI Brett Taylor is a 22 y.o. male here today for ED follow up.  Visited ED on 7/1 with complaint of chest pain.  He did have some labwork and EKG completed however left without being seen.  Labs and EKG were normal.  Reports that he feels fine today and actually was feeling better in ED which is why he left.  Reports that he gets this feeling from time to time however was worse this time.  Describes as burning sensation and often accompanied by sour/acid taste in his throat. Denies abdominal pain, nausea or vomiting, fever, chills, shortness of breath, palpitations or dark stools.   ROS:  ROS completed and negative except as noted per HPI  No Known Allergies  Past Medical History:  Diagnosis Date  . ADHD (attention deficit hyperactivity disorder)   . History of heart murmur in childhood   . Wears glasses     Past Surgical History:  Procedure Laterality Date  . NO PAST SURGERIES    . TONSILLECTOMY      Social History   Socioeconomic History  . Marital status: Single    Spouse name: Not on file  . Number of children: Not on file  . Years of education: 3912  . Highest education level: Not on file  Occupational History  . Not on file  Social Needs  . Financial resource strain: Not on file  . Food insecurity:    Worry: Not on file    Inability: Not on file  . Transportation needs:    Medical: Not on file    Non-medical: Not on file  Tobacco Use  . Smoking status: Current Some Day Smoker    Packs/day: 0.25    Years: 4.00    Pack years: 1.00    Types: Cigarettes  . Smokeless tobacco: Never Used  . Tobacco comment: 03/25/2016 "smoked a black right before I came in"  Substance and Sexual Activity  . Alcohol use: Yes    Alcohol/week: 0.0 oz  . Drug use: Yes    Types: Marijuana  .  Sexual activity: Yes  Lifestyle  . Physical activity:    Days per week: Not on file    Minutes per session: Not on file  . Stress: Not on file  Relationships  . Social connections:    Talks on phone: Not on file    Gets together: Not on file    Attends religious service: Not on file    Active member of club or organization: Not on file    Attends meetings of clubs or organizations: Not on file    Relationship status: Not on file  Other Topics Concern  . 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  [employer].   No college currently.   Exercise -active on the job, lifts boxes up to 50lb, lots of walking, goes to the gym 3-4 times per week.  No current relatioship    Family History  Problem Relation Age of Onset  . Hypertension Mother   . Cancer Maternal Grandmother        breast  . Heart disease Maternal Grandfather   . Diabetes Other  maternal side  . Stroke Neg Hx     Health Maintenance  Topic Date Due  . HIV Screening  05/01/2010  . INFLUENZA VACCINE  08/13/2017  . TETANUS/TDAP  08/16/2024    ----------------------------------------------------------------------------------------------------------------------------------------------------------------------------------------------------------------- Physical Exam BP 130/90 (BP Location: Left Arm, Patient Position: Sitting, Cuff Size: Normal)   Pulse 91   Temp 98 F (36.7 C) (Oral)   Ht 6\' 4"  (1.93 m)   Wt 255 lb (115.7 kg)   SpO2 96%   BMI 31.04 kg/m   Physical Exam  Constitutional: He is oriented to person, place, and time. He appears well-nourished. No distress.  HENT:  Head: Normocephalic and atraumatic.  Mouth/Throat: Oropharynx is clear and moist.  Eyes: No scleral icterus.  Neck: Neck supple. No thyromegaly present.  Cardiovascular: Normal rate, regular rhythm and normal heart sounds.    Pulmonary/Chest: Effort normal and breath sounds normal.  Abdominal: Soft. Bowel sounds are normal.  Musculoskeletal: He exhibits no edema.  Lymphadenopathy:    He has no cervical adenopathy.  Neurological: He is alert and oriented to person, place, and time.  Skin: Skin is warm and dry. No rash noted.  Psychiatric: He has a normal mood and affect. His behavior is normal.    ------------------------------------------------------------------------------------------------------------------------------------------------------------------------------------------------------------------- Assessment and Plan  Chest pain Chest pain seems to be related to GERD Recommend trial of ranitidine, dietary changes to help with prevention He will let me know if this is not helping.

## 2018-03-22 ENCOUNTER — Encounter: Payer: Self-pay | Admitting: Family Medicine

## 2018-03-22 ENCOUNTER — Other Ambulatory Visit (HOSPITAL_COMMUNITY)
Admission: RE | Admit: 2018-03-22 | Discharge: 2018-03-22 | Disposition: A | Discharge status: Home / Self Care | Payer: BLUE CROSS/BLUE SHIELD | Source: Ambulatory Visit | Attending: Family Medicine | Admitting: Family Medicine

## 2018-03-22 ENCOUNTER — Ambulatory Visit (INDEPENDENT_AMBULATORY_CARE_PROVIDER_SITE_OTHER): Payer: BLUE CROSS/BLUE SHIELD | Admitting: Family Medicine

## 2018-03-22 VITALS — BP 122/78 | HR 84 | Temp 98.0°F | Ht 76.0 in | Wt 265.6 lb

## 2018-03-22 DIAGNOSIS — R079 Chest pain, unspecified: Secondary | ICD-10-CM | POA: Diagnosis not present

## 2018-03-22 DIAGNOSIS — Z202 Contact with and (suspected) exposure to infections with a predominantly sexual mode of transmission: Secondary | ICD-10-CM | POA: Diagnosis not present

## 2018-03-22 NOTE — Assessment & Plan Note (Signed)
Orders Placed This Encounter  Procedures  . HIV antibody (with reflex)  . RPR  Urine cytology for GC/Chlamydia ordered.

## 2018-03-22 NOTE — Assessment & Plan Note (Signed)
-  Resolved, he will f/u prn if this returns.

## 2018-03-22 NOTE — Progress Notes (Signed)
Brett Taylor - 23 y.o. male MRN [id number]  Date of birth: [birth year]  Subjective Chief Complaint  Patient presents with  . Follow-up    HPI Brett Taylor is a 23 y.o. male here today for follow up of chest pain and discuss exposure to STD.  He reports that chest pain has resolved.  He never tried anything and it went away on its own.  He denies palpitations, abdominal pain, nausea or vomiting, or shortness of breath.   He also reports that a recent partner let him know that they tested positive for gonorrhea and that she should be tested as well.  He reports using condoms with intercourse and denies any symptoms including penile discharge, dysuria, testicular pain, groin pain, fever, chills, joint pain or rash.   He would like testing completed today.   ROS:  A comprehensive ROS was completed and negative except as noted per HPI  No Known Allergies  Past Medical History:  Diagnosis Date  . ADHD (attention deficit hyperactivity disorder)   . History of heart murmur in childhood   . Wears glasses     Past Surgical History:  Procedure Laterality Date  . NO PAST SURGERIES    . TONSILLECTOMY      Social History   Socioeconomic History  . Marital status: Single    Spouse name: Not on file  . Number of children: Not on file  . Years of education: 64  . Highest education level: Not on file  Occupational History  . Not on file  Social Needs  . Financial resource strain: Not on file  . Food insecurity:    Worry: Not on file    Inability: Not on file  . Transportation needs:    Medical: Not on file    Non-medical: Not on file  Tobacco Use  . Smoking status: Current Some Day Smoker    Packs/day: 0.25    Years: 4.00    Pack years: 1.00    Types: Cigarettes  . Smokeless tobacco: Never Used  . Tobacco comment: 03/25/2016 "smoked a black right before I came in"  Substance and Sexual Activity  . Alcohol use: Yes    Alcohol/week: 0.0 standard drinks  . Drug use: Yes   Types: Marijuana  . Sexual activity: Yes  Lifestyle  . Physical activity:    Days per week: Not on file    Minutes per session: Not on file  . Stress: Not on file  Relationships  . Social connections:    Talks on phone: Not on file    Gets together: Not on file    Attends religious service: Not on file    Active member of club or organization: Not on file    Attends meetings of clubs or organizations: Not on file    Relationship status: Not on file  Other Topics Concern  . 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  [employer] for high school.   No college currently.   Exercise -active on the job, lifts boxes up to 50lb, lots of walking, goes to the gym 3-4 times per week.  No current relatioship    Family History  Problem Relation Age of Onset  . Hypertension Mother   . Cancer Maternal Grandmother        breast  . Heart disease Maternal Grandfather   . Diabetes Other  maternal side  . Stroke Neg Hx     Health Maintenance  Topic Date Due  . HIV Screening  05/01/2010  . INFLUENZA VACCINE  08/13/2017  . TETANUS/TDAP  08/16/2024    ----------------------------------------------------------------------------------------------------------------------------------------------------------------------------------------------------------------- Physical Exam BP 122/78 (BP Location: Right Arm, Patient Position: Sitting, Cuff Size: Normal)   Pulse 84   Temp 98 F (36.7 C) (Oral)   Ht 6\' 4"  (1.93 m)   Wt 265 lb 9.6 oz (120.5 kg)   SpO2 95%   BMI 32.33 kg/m   Physical Exam Constitutional:      Appearance: Normal appearance.  HENT:     Head: Normocephalic and atraumatic.     Mouth/Throat:     Mouth: Mucous membranes are moist.  Eyes:     General: No scleral icterus. Neck:     Musculoskeletal: Neck supple.  Cardiovascular:     Rate and Rhythm: Normal rate and regular  rhythm.  Pulmonary:     Effort: Pulmonary effort is normal.     Breath sounds: Normal breath sounds.  Abdominal:     General: Abdomen is flat. There is no distension.     Tenderness: There is no abdominal tenderness.  Skin:    General: Skin is warm and dry.     Findings: No rash.  Neurological:     General: No focal deficit present.     Mental Status: He is alert.  Psychiatric:        Mood and Affect: Mood normal.     ------------------------------------------------------------------------------------------------------------------------------------------------------------------------------------------------------------------- Assessment and Plan  Chest pain -Resolved, he will f/u prn if this returns.   Exposure to STD Orders Placed This Encounter  Procedures  . HIV antibody (with reflex)  . RPR  Urine cytology for GC/Chlamydia ordered.

## 2018-03-22 NOTE — Patient Instructions (Signed)

## 2018-03-23 LAB — HIV ANTIBODY (ROUTINE TESTING W REFLEX): HIV 1&2 Ab, 4th Generation: NONREACTIVE

## 2018-03-23 LAB — RPR: RPR Ser Ql: NONREACTIVE

## 2018-03-24 LAB — URINE CYTOLOGY ANCILLARY ONLY
CHLAMYDIA, DNA PROBE: POSITIVE — AB
NEISSERIA GONORRHEA: NEGATIVE

## 2018-03-25 ENCOUNTER — Telehealth: Payer: Self-pay

## 2018-03-25 ENCOUNTER — Other Ambulatory Visit: Payer: Self-pay | Admitting: Family Medicine

## 2018-03-25 MED ORDER — AZITHROMYCIN 500 MG PO TABS: 1000.0000 mg | ORAL_TABLET | Freq: Every day | ORAL | 0 refills | Status: DC

## 2018-03-25 NOTE — Telephone Encounter (Signed)
Please see result note.  Thanks.

## 2018-03-25 NOTE — Telephone Encounter (Signed)
Copied from CRM [id number]. Topic: General - Other >> Mar 24, 2018  4:32 PM Marylen Ponto wrote: Reason for CRM: Pt called in for lab results. Pt requests call back with lab results.

## 2018-03-25 NOTE — Progress Notes (Signed)
-  Recent testing with + Chlamydia, gonorrhea is negative.  Will treat with azithromycin.  Rx sent to pharmacy.   -Syphilis/HIV negative.

## 2018-03-25 NOTE — Telephone Encounter (Signed)
Brett Taylor will contact pt. Will close this note

## 2018-04-18 ENCOUNTER — Encounter (HOSPITAL_COMMUNITY): Payer: Self-pay | Admitting: Emergency Medicine

## 2018-04-18 ENCOUNTER — Other Ambulatory Visit: Payer: Self-pay

## 2018-04-18 ENCOUNTER — Ambulatory Visit (HOSPITAL_COMMUNITY)
Admission: EM | Admit: 2018-04-18 | Discharge: 2018-04-18 | Disposition: A | Discharge status: Home / Self Care | Payer: BLUE CROSS/BLUE SHIELD | Attending: Family Medicine | Admitting: Family Medicine

## 2018-04-18 DIAGNOSIS — R079 Chest pain, unspecified: Secondary | ICD-10-CM | POA: Diagnosis not present

## 2018-04-18 MED ORDER — OMEPRAZOLE 20 MG PO CPDR: 20.0000 mg | DELAYED_RELEASE_CAPSULE | Freq: Every day | ORAL | 0 refills | Status: DC

## 2018-04-18 MED ORDER — ALBUTEROL SULFATE HFA 108 (90 BASE) MCG/ACT IN AERS: 1.0000 | INHALATION_SPRAY | Freq: Four times a day (QID) | RESPIRATORY_TRACT | 0 refills | Status: DC | PRN

## 2018-04-18 MED ORDER — CETIRIZINE HCL 10 MG PO TABS: 10.0000 mg | ORAL_TABLET | Freq: Every day | ORAL | 0 refills | Status: DC

## 2018-04-18 NOTE — ED Provider Notes (Signed)
[facility]    CSN: [id number] Arrival date & time: 04/18/18  1112     History   Chief Complaint Chief Complaint  Patient presents with  . Cough    HPI Brett Taylor is a 23 y.o. male.   Brett Taylor presents with complaints of episodes of chest pressure and shortness of breath . Was outside raking leaves approximately 5 days ago, later that night felt chest pressure, and shortness of breath. Has felt nightly intermittently since. Two nights ago felt dizzy like he might "pass out" and chest pressure. Occasional cough, non productive. Runny  Nose. No sore throat or ear pain. No rash. No known fevers. No gi/gu complaints. No known ill contacts. No travel. Around family only, not working. No one at home ill. No medications for symptoms. Denies previous similar. On chart review it does appear he has been seen in ED as well as with his PCP for similar complaints.  States it has woken him up at times from sleeping. No current pain. No palpitations.    ROS per HPI, negative if not otherwise mentioned.      Past Medical History:  Diagnosis Date  . ADHD (attention deficit hyperactivity disorder)   . History of heart murmur in childhood   . Wears glasses     Patient Active Problem List   Diagnosis Date Noted  . Exposure to STD 03/22/2018  . Chest pain 07/17/2017  . Well adult exam 07/03/2017  . Healthy adult on routine physical examination 05/14/2015  . 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 Elevated blood sugar 05/14/2015  . Overweight 05/14/2015  . Need for HPV vaccination 05/14/2015    Past Surgical History:  Procedure Laterality Date  . NO PAST SURGERIES    . TONSILLECTOMY         Home Medications    Prior to Admission medications   Medication Sig Start Date End Date Taking? Authorizing Provider  albuterol (PROAIR HFA) 108 (90 Base) MCG/ACT inhaler Inhale 1-2 puffs into the lungs every 6 (six) hours as needed for wheezing or shortness of breath. 04/18/18   Georgetta Haber, NP  azithromycin (ZITHROMAX) 500 MG tablet Take 2 tablets (1,000 mg total) by mouth daily. Patient not taking: Reported on 04/18/2018 03/25/18   Everrett Coombe, DO  cetirizine (ZYRTEC) 10 MG tablet Take 1 tablet (10 mg total) by mouth daily. 04/18/18   Georgetta Haber, NP  omeprazole (PRILOSEC) 20 MG capsule Take 1 capsule (20 mg total) by mouth daily. 04/18/18   Georgetta Haber, NP    Family History Family History  Problem Relation Age of Onset  . Hypertension Mother   . Cancer Maternal Grandmother        breast  . Heart disease Maternal Grandfather   . Diabetes Other        maternal side  . Stroke Neg Hx     Social History Social History   Tobacco Use  . Smoking status: Current Some Day Smoker    Packs/day: 0.25    Years: 4.00    Pack years: 1.00    Types: Cigarettes  . Smokeless tobacco: Never Used  . Tobacco comment: 03/25/2016 "smoked a black right before I came in"  Substance Use Topics  . Alcohol use: Yes    Alcohol/week: 0.0 standard drinks  . Drug use: Yes    Types: Marijuana     Allergies   Patient has no known allergies.   Review of Systems Review of Systems  Physical Exam Triage Vital Signs ED Triage Vitals [04/18/18 1149]  Enc Vitals Group     BP 136/87     Pulse Rate 91     Resp 18     Temp 99.1 F (37.3 C)     Temp Source Oral     SpO2 97 %     Weight      Height      Head Circumference      Peak Flow      Pain Score 5     Pain Loc      Pain Edu?      Excl. in GC?    No data found.  Updated Vital Signs BP 136/87 (BP Location: Right Arm)   Pulse 91   Temp 99.1 F (37.3 C) (Oral)   Resp 18   SpO2 97%    Physical Exam Constitutional:      Appearance: He is well-developed.  HENT:     Head: Normocephalic and atraumatic.  Cardiovascular:     Rate and Rhythm: Normal rate and regular rhythm.  Pulmonary:     Effort: Pulmonary effort is normal. No respiratory distress.  Skin:    General: Skin is warm and dry.   Neurological:     Mental Status: He is alert and oriented to person, place, and time.    EKG: NSR rate 64 . Previous EKG was available for review without acute changes. No stwave changes as interpreted by me.    UC Treatments / Results  Labs (all labs ordered are listed, but only abnormal results are displayed) Labs Reviewed - No data to display  EKG None  Radiology No results found.  Procedures Procedures (including critical care time)  Medications Ordered in UC Medications - No data to display  Initial Impression / Assessment and Plan / UC Course  I have reviewed the triage vital signs and the nursing notes.  Pertinent labs & imaging results that were available during my care of the patient were reviewed by me and considered in my medical decision making (see chart for details).     Non toxic. Afebrile. Per chart review patient has had intermittent symptoms of this since last July. No current chest pain . Otherwise healthy. Worse at night. Some URI symptoms after raking outside. Question gerd and possibly some allergy components. EKG WNL. Return precautions provided. Patient verbalized understanding and agreeable to plan.  Ambulatory out of clinic without difficulty.   Final Clinical Impressions(s) / UC Diagnoses   Final diagnoses:  Chest pain, unspecified type     Discharge Instructions     I question if you are experiencing reflux which may be causing your symptoms. Stress, diet, smoking can all increase these symptoms.  Please start daily omeprazole.  Allergies may be causing your congestion, may take daily zyrtec.  Use of inhaler as needed for wheezing or shortness of breath.   If develop worsening of chest pain , shortness of breath , dizziness, nausea, sweating, or otherwise worsening please go to the Er.     ED Prescriptions    Medication Sig Dispense Auth. Provider   omeprazole (PRILOSEC) 20 MG capsule Take 1 capsule (20 mg total) by mouth daily. 30  capsule Linus Mako B, NP   cetirizine (ZYRTEC) 10 MG tablet Take 1 tablet (10 mg total) by mouth daily. 30 tablet Linus Mako B, NP   albuterol (PROAIR HFA) 108 (90 Base) MCG/ACT inhaler Inhale 1-2 puffs into the lungs every 6 (six) hours as  needed for wheezing or shortness of breath. 1 Inhaler Georgetta Haber, NP     Controlled Substance Prescriptions [place] Controlled Substance Registry consulted? Not Applicable   Georgetta Haber, NP 04/18/18 1302

## 2018-04-18 NOTE — Discharge Instructions (Signed)
I question if you are experiencing reflux which may be causing your symptoms. Stress, diet, smoking can all increase these symptoms.  Please start daily omeprazole.  Allergies may be causing your congestion, may take daily zyrtec.  Use of inhaler as needed for wheezing or shortness of breath.   If develop worsening of chest pain , shortness of breath , dizziness, nausea, sweating, or otherwise worsening please go to the Er.

## 2018-04-18 NOTE — ED Triage Notes (Signed)
Pt here with cough and chest congestion x 4 days

## 2018-04-23 ENCOUNTER — Emergency Department (HOSPITAL_COMMUNITY): Payer: BLUE CROSS/BLUE SHIELD

## 2018-04-23 ENCOUNTER — Emergency Department (HOSPITAL_COMMUNITY)
Admission: EM | Admit: 2018-04-23 | Discharge: 2018-04-23 | Disposition: A | Discharge status: Home / Self Care | Payer: BLUE CROSS/BLUE SHIELD | Attending: Emergency Medicine | Admitting: Emergency Medicine

## 2018-04-23 ENCOUNTER — Ambulatory Visit: Payer: Self-pay

## 2018-04-23 ENCOUNTER — Other Ambulatory Visit: Payer: Self-pay

## 2018-04-23 ENCOUNTER — Encounter (HOSPITAL_COMMUNITY): Payer: Self-pay | Admitting: Emergency Medicine

## 2018-04-23 DIAGNOSIS — R05 Cough: Secondary | ICD-10-CM | POA: Diagnosis not present

## 2018-04-23 DIAGNOSIS — F1290 Cannabis use, unspecified, uncomplicated: Secondary | ICD-10-CM | POA: Insufficient documentation

## 2018-04-23 DIAGNOSIS — R0602 Shortness of breath: Secondary | ICD-10-CM | POA: Insufficient documentation

## 2018-04-23 DIAGNOSIS — R0789 Other chest pain: Secondary | ICD-10-CM | POA: Insufficient documentation

## 2018-04-23 DIAGNOSIS — F17210 Nicotine dependence, cigarettes, uncomplicated: Secondary | ICD-10-CM | POA: Insufficient documentation

## 2018-04-23 DIAGNOSIS — F909 Attention-deficit hyperactivity disorder, unspecified type: Secondary | ICD-10-CM | POA: Insufficient documentation

## 2018-04-23 DIAGNOSIS — R079 Chest pain, unspecified: Secondary | ICD-10-CM | POA: Diagnosis not present

## 2018-04-23 LAB — BASIC METABOLIC PANEL
Anion gap: 12 (ref 5–15)
BUN: 10 mg/dL (ref 6–20)
CO2: 23 mmol/L (ref 22–32)
Calcium: 9.9 mg/dL (ref 8.9–10.3)
Chloride: 104 mmol/L (ref 98–111)
Creatinine, Ser: 1.19 mg/dL (ref 0.61–1.24)
GFR calc Af Amer: >60 mL/min (ref >60)
GFR calc non Af Amer: >60 mL/min (ref >60)
Glucose, Bld: 91 mg/dL (ref 70–99)
Potassium: 4.5 mmol/L (ref 3.5–5.1)
Sodium: 139 mmol/L (ref 135–145)

## 2018-04-23 LAB — CBC
HCT: 47.2 % (ref 39.0–52.0)
Hemoglobin: 15.4 g/dL (ref 13.0–17.0)
MCH: 28 pg (ref 26.0–34.0)
MCHC: 32.6 g/dL (ref 30.0–36.0)
MCV: 85.8 fL (ref 80.0–100.0)
Platelets: 282 10*3/uL (ref 150–400)
RBC: 5.5 MIL/uL (ref 4.22–5.81)
RDW: 12.2 % (ref 11.5–15.5)
WBC: 4.8 10*3/uL (ref 4.0–10.5)
nRBC: 0 % (ref 0.0–0.2)

## 2018-04-23 LAB — TROPONIN I: Troponin I: <0.03 ng/mL (ref <0.03)

## 2018-04-23 LAB — D-DIMER, QUANTITATIVE: D-Dimer, Quant: 0.31 ug/mL-FEU (ref 0.00–0.50)

## 2018-04-23 MED ORDER — SODIUM CHLORIDE 0.9% FLUSH: 3.0000 mL | Freq: Once | INTRAVENOUS | Status: DC

## 2018-04-23 NOTE — Discharge Instructions (Addendum)
° °Individuals who are confirmed to have, or are being evaluated for, COVID-19 should follow the prevention steps below °until a healthcare provider or local or state health department says they can return to normal activities. ° °Stay home except to get medical care °You should restrict activities outside your home, except for getting medical care. Do not go to work, school, or public °areas, and do not use public transportation or taxis. ° °Call ahead before visiting your doctor °Before your medical appointment, call the healthcare provider and tell them that you have, or are being evaluated for, °COVID-19 infection. This will help the healthcare provider’s office take steps to keep other people from getting infected. °Ask your healthcare provider to call the local or state health department. ° °Monitor your symptoms °Seek prompt medical attention if your illness is worsening (e.g., difficulty breathing). Before going to your medical °appointment, call the healthcare provider and tell them that you have, or are being evaluated for, COVID-19 infection. Ask °your healthcare provider to call the local or state health department. ° °Wear a facemask °You should wear a facemask that covers your nose and mouth when you are in the same room with other people and °when you visit a healthcare provider. People who live with or visit you should also wear a facemask while they are in the °same room with you. ° °Separate yourself from other people in your home °As much as possible, you should stay in a different room from other people in your home. Also, you should use a separate °bathroom, if available. ° °Avoid sharing household items °You should not share dishes, drinking glasses, cups, eating utensils, towels, bedding, or other items with other people in °your home. After using these items, you should wash them thoroughly with soap and water. ° °Cover your coughs and sneezes °Cover your mouth and nose with a tissue when  you cough or sneeze, or you can cough or sneeze into your sleeve. Throw °used tissues in a lined trash can, and immediately wash your hands with soap and water for at least 20 seconds or use an °alcohol-based hand rub. ° °Wash your hands °Wash your hands often and thoroughly with soap and water for at least 20 seconds. You can use an alcohol-based hand °sanitizer if soap and water are not available and if your hands are not visibly dirty. Avoid touching your eyes, nose, and °mouth with unwashed hands. ° ° °Prevention Steps for Caregivers and Household Members of °Individuals Confirmed to have, or Being Evaluated for, COVID-19 Infection Being Cared for in the Home ° °If you live with, or provide care at home for, a person confirmed to have, or being evaluated for, COVID-19 infection °please follow these guidelines to prevent infection: ° °Follow healthcare provider’s instructions °Make sure that you understand and can help the patient follow any healthcare provider instructions for all care. ° °Provide for the patient’s basic needs °You should help the patient with basic needs in the home and provide support for getting groceries, prescriptions, and °other personal needs. ° °Monitor the patient’s symptoms °If they are getting sicker, call his or her medical provider and tell them that the patient has, or is being evaluated for, °COVID-19 infection. This will help the healthcare provider’s office take steps to keep other people from getting infected. °Ask the healthcare provider to call the local or state health department. ° °Limit the number of people who have contact with the patient °If possible, have only one caregiver for the   patient. °Other household members should stay in another home or place of residence. If this is not possible, they should stay °in another room, or be separated from the patient as much as possible. Use a separate bathroom, if available. °Restrict visitors who do not have an essential need  to be in the home. ° °Keep older adults, very young children, and other sick people away from the patient °Keep older adults, very young children, and those who have compromised immune systems or chronic health conditions away from the patient. This includes people with chronic heart, lung, or kidney conditions, diabetes, and cancer. ° °Ensure good ventilation °Make sure that shared spaces in the home have good air flow, such as from an air conditioner or an opened window, °weather permitting. ° °Wash your hands often °Wash your hands often and thoroughly with soap and water for at least 20 seconds. You can use an alcohol based hand sanitizer if soap and water are not available and if your hands are not visibly dirty. °Avoid touching your eyes, nose, and mouth with unwashed hands. °Use disposable paper towels to dry your hands. If not available, use dedicated cloth towels and replace them when they become wet. ° °Wear a facemask and gloves °Wear a disposable facemask at all times in the room and gloves when you touch or have contact with the patient’s blood, body fluids, and/or secretions or excretions, such as sweat, saliva, sputum, nasal mucus, vomit, urine, or feces.  Ensure the mask fits over your nose and mouth tightly, and do not touch it during use. °Throw out disposable facemasks and gloves after using them. Do not reuse. °Wash your hands immediately after removing your facemask and gloves. °If your personal clothing becomes contaminated, carefully remove clothing and launder. Wash your hands after handling contaminated clothing. °Place all used disposable facemasks, gloves, and other waste in a lined container before disposing them with other household waste. °Remove gloves and wash your hands immediately after handling these items. ° °Do not share dishes, glasses, or other household items with the patient °Avoid sharing household items. You should not share dishes, drinking glasses, cups, eating utensils,  towels, bedding, or other items with a patient who is confirmed to have, or being evaluated for, COVID-19 infection. °After the person uses these items, you should wash them thoroughly with soap and water. ° °Wash laundry thoroughly °Immediately remove and wash clothes or bedding that have blood, body fluids, and/or secretions or excretions, such as sweat, saliva, sputum, nasal mucus, vomit, urine, or feces, on them. °Wear gloves when handling laundry from the patient. °Read and follow directions on labels of laundry or clothing items and detergent. In general, wash and dry with the warmest temperatures recommended on the label. ° °Clean all areas the individual has used often °Clean all touchable surfaces, such as counters, tabletops, doorknobs, bathroom fixtures, toilets, phones, keyboards, tablets, and bedside tables, every day. Also, clean any surfaces that may have blood, body fluids, and/or secretions or excretions on them. °Wear gloves when cleaning surfaces the patient has come in contact with. °Use a diluted bleach solution (e.g., dilute bleach with 1 part bleach and 10 parts water) or a household disinfectant with a label that says EPA-registered for coronaviruses. To make a bleach solution at home, add 1 tablespoon of bleach to 1 quart (4 cups) of water. For a larger supply, add ¼ cup of bleach to 1 gallon (16 cups) of water. °Read labels of cleaning products and follow recommendations provided on   product labels. Labels contain instructions for safe and effective use of the cleaning product including precautions you should take when applying the product, such as wearing gloves or eye protection and making sure you have good ventilation during use of the product. °Remove gloves and wash hands immediately after cleaning. ° °Monitor yourself for signs and symptoms of illness °Caregivers and household members are considered close contacts, should monitor their health, and will be asked to limit °movement  outside of the home to the extent possible. Follow the monitoring steps for close contacts listed on the °symptom monitoring form. ° ° °? If you have additional questions, contact your local health department or call the epidemiologist on call at [phone] °(available 24/7). °? This guidance is subject to change. For the most up-to-date guidance from CDC, please refer to their website: °https://www.cdc.gov/coronavirus/2019-ncov/hcp/guidance-prevent-spread.html ° °

## 2018-04-23 NOTE — ED Triage Notes (Signed)
Pt reports substernal chest pain and sob that began 1 week ago and was seen at [facility]. Pt reports being given medication for allergies and inhaler but this morning fel like he was having difficulty catching his breath with tightness in his chest. Pt denies recent travel, no known exposure to covid-19.

## 2018-04-23 NOTE — ED Provider Notes (Addendum)
[facility] EMERGENCY DEPARTMENT Provider Note   CSN: [id number] Arrival date & time: 04/23/18  1518    History   Chief Complaint Chief Complaint  Patient presents with  . Chest Pain    HPI Brett Taylor is a 23 y.o. male.     The history is provided by the patient and medical records. No language interpreter was used.  Chest Pain   Brett Taylor is a 23 y.o. male who presents to the Emergency Department complaining of chest pain. He presents for evaluation of a week and a half of chest pain and shortness of breath. Symptoms are described as a chest tightness. He feels short of breath with laying flat and is waking at night due to shortness of breath. He has mild, nonproductive cough. No fevers. He denies any abdominal pain, leg swelling or pain. He has no medical problems and takes no medications. No history of blood clots. He has no known sick contacts and no recent travel. He was seen in urgent care four days ago for similar symptoms. Past Medical History:  Diagnosis Date  . ADHD (attention deficit hyperactivity disorder)   . History of heart murmur in childhood   . Wears glasses     Patient Active Problem List   Diagnosis Date Noted  . Exposure to STD 03/22/2018  . Chest pain 07/17/2017  . Well adult exam 07/03/2017  . Healthy adult on routine physical examination 05/14/2015  . 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 Elevated blood sugar 05/14/2015  . Overweight 05/14/2015  . Need for HPV vaccination 05/14/2015    Past Surgical History:  Procedure Laterality Date  . NO PAST SURGERIES    . TONSILLECTOMY          Home Medications    Prior to Admission medications   Medication Sig Start Date End Date Taking? Authorizing Provider  albuterol (PROAIR HFA) 108 (90 Base) MCG/ACT inhaler Inhale 1-2 puffs into the lungs every 6 (six) hours as needed for wheezing or shortness of breath. 04/18/18  Yes Linus Mako B, NP  cetirizine (ZYRTEC) 10 MG tablet Take 1  tablet (10 mg total) by mouth daily. 04/18/18  Yes Linus Mako B, NP  omeprazole (PRILOSEC) 20 MG capsule Take 1 capsule (20 mg total) by mouth daily. 04/18/18  Yes Georgetta Haber, NP    Family History Family History  Problem Relation Age of Onset  . Hypertension Mother   . Cancer Maternal Grandmother        breast  . Heart disease Maternal Grandfather   . Diabetes Other        maternal side  . Stroke Neg Hx     Social History Social History   Tobacco Use  . Smoking status: Current Some Day Smoker    Packs/day: 0.25    Years: 4.00    Pack years: 1.00    Types: Cigarettes  . Smokeless tobacco: Never Used  . Tobacco comment: 03/25/2016 "smoked a black right before I came in"  Substance Use Topics  . Alcohol use: Yes    Alcohol/week: 0.0 standard drinks  . Drug use: Yes    Types: Marijuana     Allergies   Patient has no known allergies.   Review of Systems Review of Systems  Cardiovascular: Positive for chest pain.  All other systems reviewed and are negative.    Physical Exam Updated Vital Signs BP (!) 146/85 (BP Location: Right Arm)   Pulse 91   Temp 98 F (  36.7 C) (Oral)   Resp 16   SpO2 97%   Physical Exam Vitals signs and nursing note reviewed.  Constitutional:      Appearance: He is well-developed.  HENT:     Head: Normocephalic and atraumatic.  Cardiovascular:     Rate and Rhythm: Normal rate and regular rhythm.     Heart sounds: No murmur.  Pulmonary:     Effort: Pulmonary effort is normal. No respiratory distress.     Breath sounds: Normal breath sounds.  Abdominal:     Palpations: Abdomen is soft.     Tenderness: There is no abdominal tenderness. There is no guarding or rebound.  Musculoskeletal:        General: No swelling or tenderness.  Skin:    General: Skin is warm and dry.  Neurological:     Mental Status: He is alert and oriented to person, place, and time.  Psychiatric:        Behavior: Behavior normal.      ED  Treatments / Results  Labs (all labs ordered are listed, but only abnormal results are displayed) Labs Reviewed  BASIC METABOLIC PANEL  CBC  TROPONIN I  D-DIMER, QUANTITATIVE (NOT AT [facility])    EKG None ED ECG REPORT   Date: 04/23/2018  Rate: 80  Rhythm: normal sinus rhythm  QRS Axis: normal  Intervals: normal  ST/T Wave abnormalities: ST elevations anteriorly c/w early repolarization  Conduction Disutrbances:none  Narrative Interpretation:   Old EKG Reviewed: unchanged  I have personally reviewed the EKG tracing and disagree with the computerized printout as noted.  Radiology Dg Chest Port 1 View  Result Date: 04/23/2018 CLINICAL DATA:  Chest pain EXAM: PORTABLE CHEST 1 VIEW COMPARISON:  07/13/2017 FINDINGS: Heart and mediastinal contours are within normal limits. No focal opacities or effusions. No acute bony abnormality. IMPRESSION: No active disease. Electronically Signed   By: Charlett Nose M.D.   On: 04/23/2018 16:59    Procedures Procedures (including critical care time)  Medications Ordered in ED Medications  sodium chloride flush (NS) 0.9 % injection 3 mL (has no administration in time range)     Initial Impression / Assessment and Plan / ED Course  I have reviewed the triage vital signs and the nursing notes.  Pertinent labs & imaging results that were available during my care of the patient were reviewed by me and considered in my medical decision making (see chart for details).        Patient here for evaluation of a week and a half of shortness of breath with chest tightness. He is non-toxic appearing on evaluation with no respiratory distress.  Presentation is not c/w CHF, pna, PE, ACS.  D/w patient possibility of COVID virus infection, he does not meet testing guidelines at this time.  Discussed home isoloation as well as outpatient follow up and return precautions .   Brett Taylor was evaluated in Emergency Department on 04/23/2018 for the symptoms  described in the history of present illness. He was evaluated in the context of the global COVID-19 pandemic, which necessitated consideration that the patient might be at risk for infection with the SARS-CoV-2 virus that causes COVID-19. Institutional protocols and algorithms that pertain to the evaluation of patients at risk for COVID-19 are in a state of rapid change based on information released by regulatory bodies including the CDC and federal and state organizations. These policies and algorithms were followed during the patient's care in the ED.   Final Clinical  Impressions(s) / ED Diagnoses   Final diagnoses:  Atypical chest pain  Shortness of breath    ED Discharge Orders    None       Tilden Fossaees, Mikena Masoner, MD 04/23/18 1734    Tilden Fossaees, Enoch Moffa, MD 04/23/18 1736

## 2018-04-23 NOTE — Telephone Encounter (Signed)
Pt mother called to say that her son was SOB and having chest pressure.  She states that they were seen on Monday via 911. He had fever of 100 and SOB Dx with allergies. Today he is not better. He has developed chest pressure center chest. He has no appetite.No fever today. Per protocol call placed to [facility] ER report to charge nurse Scott. Care advice rea to pt mother. She verbalized understanding of all instructions.  Reason for Disposition . MODERATE difficulty breathing (e.g., speaks in phrases, SOB even at rest, pulse 100-120)  Answer Assessment - Initial Assessment Questions 1. COVID-19 DIAGNOSIS: "Who made your Coronavirus (COVID-19) diagnosis?" "Was it confirmed by a positive lab test?" If not diagnosed by a HCP, ask "Are there lots of cases (community spread) where you live?" (See public health department website, if unsure)   * MAJOR community spread: high number of cases; numbers of cases are increasing; many people hospitalized.   * MINOR community spread: low number of cases; not increasing; few or no people hospitalized   Unknown [place] 2. ONSET: "When did the COVID-19 symptoms start?"     Week before 3. WORST SYMPTOM: "What is your worst symptom?" (e.g., cough, fever, shortness of breath, muscle aches)     Chest pain fever SOB 4. COUGH: "How bad is the cough?"       yes 5. FEVER: "Do you have a fever?" If so, ask: "What is your temperature, how was it measured, and when did it start?"     Temperature 97.4 6. RESPIRATORY STATUS: "Describe your breathing?" (e.g., shortness of breath, wheezing, unable to speak)     SOB 7. BETTER-SAME-WORSE: "Are you getting better, staying the same or getting worse compared to yesterday?"  If getting worse, ask, "In what way?"    same 8. HIGH RISK DISEASE: "Do you have any chronic medical problems?" (e.g., asthma, heart or lung disease, weak immune system, etc.)    no 9. PREGNANCY: "Is there any chance you are pregnant?" "When was your last  menstrual period?"     N/A 10. OTHER SYMPTOMS: "Do you have any other symptoms?"  (e.g., runny nose, headache, sore throat, loss of smell)      Runny nose, sore throat, loss of appitite  Protocols used: CORONAVIRUS (COVID-19) DIAGNOSED OR SUSPECTED-A-AH

## 2018-05-30 ENCOUNTER — Emergency Department (HOSPITAL_COMMUNITY): Payer: BLUE CROSS/BLUE SHIELD

## 2018-05-30 ENCOUNTER — Other Ambulatory Visit: Payer: Self-pay

## 2018-05-30 ENCOUNTER — Encounter (HOSPITAL_COMMUNITY): Payer: Self-pay | Admitting: Emergency Medicine

## 2018-05-30 ENCOUNTER — Emergency Department (HOSPITAL_COMMUNITY)
Admission: EM | Admit: 2018-05-30 | Discharge: 2018-05-31 | Discharge status: Against Medical Advice | Payer: BLUE CROSS/BLUE SHIELD | Attending: Emergency Medicine | Admitting: Emergency Medicine

## 2018-05-30 DIAGNOSIS — Z5321 Procedure and treatment not carried out due to patient leaving prior to being seen by health care provider: Secondary | ICD-10-CM | POA: Diagnosis not present

## 2018-05-30 DIAGNOSIS — R0602 Shortness of breath: Secondary | ICD-10-CM | POA: Diagnosis not present

## 2018-05-30 DIAGNOSIS — R0789 Other chest pain: Secondary | ICD-10-CM | POA: Diagnosis not present

## 2018-05-30 LAB — CBC
HCT: 42.5 % (ref 39.0–52.0)
Hemoglobin: 14.1 g/dL (ref 13.0–17.0)
MCH: 28 pg (ref 26.0–34.0)
MCHC: 33.2 g/dL (ref 30.0–36.0)
MCV: 84.5 fL (ref 80.0–100.0)
Platelets: 257 10*3/uL (ref 150–400)
RBC: 5.03 MIL/uL (ref 4.22–5.81)
RDW: 12.4 % (ref 11.5–15.5)
WBC: 6.1 10*3/uL (ref 4.0–10.5)
nRBC: 0 % (ref 0.0–0.2)

## 2018-05-30 NOTE — ED Triage Notes (Signed)
Co intermittent generalized pain across chest with SOB and back pain x 2 months.  States he was tested for COVID 2 days ago and has not received results.  Denies fever.

## 2018-05-31 DIAGNOSIS — R0602 Shortness of breath: Secondary | ICD-10-CM | POA: Diagnosis not present

## 2018-05-31 LAB — BASIC METABOLIC PANEL
Anion gap: 8 (ref 5–15)
BUN: 11 mg/dL (ref 6–20)
CO2: 21 mmol/L — ABNORMAL LOW (ref 22–32)
Calcium: 9.6 mg/dL (ref 8.9–10.3)
Chloride: 111 mmol/L (ref 98–111)
Creatinine, Ser: 1.08 mg/dL (ref 0.61–1.24)
GFR calc Af Amer: >60 mL/min (ref >60)
GFR calc non Af Amer: >60 mL/min (ref >60)
Glucose, Bld: 103 mg/dL — ABNORMAL HIGH (ref 70–99)
Potassium: 3.4 mmol/L — ABNORMAL LOW (ref 3.5–5.1)
Sodium: 140 mmol/L (ref 135–145)

## 2018-05-31 LAB — TROPONIN I: Troponin I: <0.03 ng/mL (ref <0.03)

## 2018-05-31 NOTE — ED Notes (Signed)
Pt's name called for a room no answer x1 

## 2018-05-31 NOTE — ED Notes (Signed)
No answer in waiting room.  Called pt's cell number and mom states that pt feels fine and went home to go to sleep and that he is resting.

## 2018-06-14 ENCOUNTER — Encounter: Payer: BLUE CROSS/BLUE SHIELD | Admitting: Family Medicine

## 2018-11-19 ENCOUNTER — Emergency Department (HOSPITAL_COMMUNITY): Payer: BC Managed Care – PPO

## 2018-11-19 ENCOUNTER — Emergency Department (HOSPITAL_COMMUNITY)
Admission: EM | Admit: 2018-11-19 | Discharge: 2018-11-19 | Disposition: A | Discharge status: Home / Self Care | Payer: BC Managed Care – PPO | Attending: Emergency Medicine | Admitting: Emergency Medicine

## 2018-11-19 ENCOUNTER — Other Ambulatory Visit: Payer: Self-pay

## 2018-11-19 ENCOUNTER — Encounter (HOSPITAL_COMMUNITY): Payer: Self-pay | Admitting: Emergency Medicine

## 2018-11-19 DIAGNOSIS — M79642 Pain in left hand: Secondary | ICD-10-CM | POA: Diagnosis not present

## 2018-11-19 DIAGNOSIS — S60229A Contusion of unspecified hand, initial encounter: Secondary | ICD-10-CM

## 2018-11-19 DIAGNOSIS — F17210 Nicotine dependence, cigarettes, uncomplicated: Secondary | ICD-10-CM | POA: Diagnosis not present

## 2018-11-19 DIAGNOSIS — W2201XA Walked into wall, initial encounter: Secondary | ICD-10-CM | POA: Insufficient documentation

## 2018-11-19 DIAGNOSIS — Z79899 Other long term (current) drug therapy: Secondary | ICD-10-CM | POA: Insufficient documentation

## 2018-11-19 DIAGNOSIS — Y9389 Activity, other specified: Secondary | ICD-10-CM | POA: Insufficient documentation

## 2018-11-19 DIAGNOSIS — S60222A Contusion of left hand, initial encounter: Secondary | ICD-10-CM | POA: Diagnosis not present

## 2018-11-19 DIAGNOSIS — Y929 Unspecified place or not applicable: Secondary | ICD-10-CM | POA: Diagnosis not present

## 2018-11-19 DIAGNOSIS — S60221A Contusion of right hand, initial encounter: Secondary | ICD-10-CM | POA: Insufficient documentation

## 2018-11-19 DIAGNOSIS — M79641 Pain in right hand: Secondary | ICD-10-CM | POA: Diagnosis not present

## 2018-11-19 DIAGNOSIS — Y999 Unspecified external cause status: Secondary | ICD-10-CM | POA: Insufficient documentation

## 2018-11-19 DIAGNOSIS — S6981XA Other specified injuries of right wrist, hand and finger(s), initial encounter: Secondary | ICD-10-CM | POA: Diagnosis not present

## 2018-11-19 MED ORDER — IBUPROFEN 800 MG PO TABS
800.0000 mg | ORAL_TABLET | Freq: Once | ORAL | Status: AC
  Administered 2018-11-19: 800 mg via ORAL
  Filled 2018-11-19: qty 1

## 2018-11-19 NOTE — ED Provider Notes (Signed)
[facility]-EMERGENCY DEPT Provider Note   CSN: [id number] Arrival date & time: 11/19/18  0253     History   Chief Complaint Chief Complaint  Patient presents with  . Hand Injury    HPI FEDOR KAZMIERSKI is a 23 y.o. male.     23 year old male presents to the emergency department for evaluation of bilateral hand pain after punching a brick wall with both of his hands earlier tonight.  States that his hands are sore.  This pain has been constant and unchanged.  He has not taken any medication for his symptoms.  No associated numbness or paresthesias, lacerations.  Exhibiting full range of motion of both of his hands as he is utilizing a cell phone upon my presentation to the exam room.  The history is provided by the patient. No language interpreter was used.  Hand Injury   Past Medical History:  Diagnosis Date  . ADHD (attention deficit hyperactivity disorder)   . History of heart murmur in childhood   . Wears glasses     Patient Active Problem List   Diagnosis Date Noted  . Exposure to STD 03/22/2018  . Chest pain 07/17/2017  . Well adult exam 07/03/2017  . Healthy adult on routine physical examination 05/14/2015  . 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 Elevated blood sugar 05/14/2015  . Overweight 05/14/2015  . Need for HPV vaccination 05/14/2015    Past Surgical History:  Procedure Laterality Date  . NO PAST SURGERIES    . TONSILLECTOMY          Home Medications    Prior to Admission medications   Medication Sig Start Date End Date Taking? Authorizing Provider  albuterol (PROAIR HFA) 108 (90 Base) MCG/ACT inhaler Inhale 1-2 puffs into the lungs every 6 (six) hours as needed for wheezing or shortness of breath. 04/18/18   Georgetta Haber, NP  cetirizine (ZYRTEC) 10 MG tablet Take 1 tablet (10 mg total) by mouth daily. 04/18/18   Georgetta Haber, NP  omeprazole (PRILOSEC) 20 MG capsule Take 1 capsule (20 mg total) by mouth daily. 04/18/18   Georgetta Haber, NP    Family History Family History  Problem Relation Age of Onset  . Hypertension Mother   . Cancer Maternal Grandmother        breast  . Heart disease Maternal Grandfather   . Diabetes Other        maternal side  . Stroke Neg Hx     Social History Social History   Tobacco Use  . Smoking status: Current Some Day Smoker    Packs/day: 0.25    Years: 4.00    Pack years: 1.00    Types: Cigarettes  . Smokeless tobacco: Never Used  . Tobacco comment: 03/25/2016 "smoked a black right before I came in"  Substance Use Topics  . Alcohol use: Yes    Alcohol/week: 0.0 standard drinks  . Drug use: Yes    Types: Marijuana     Allergies   Patient has no known allergies.   Review of Systems Review of Systems Ten systems reviewed and are negative for acute change, except as noted in the HPI.    Physical Exam Updated Vital Signs BP (!) 151/85   Pulse 84   Temp 98 F (36.7 C) (Oral)   Resp 16   Ht 6\' 3"  (1.905 m)   Wt 120 kg   SpO2 100%   BMI 33.07 kg/m   Physical Exam Vitals signs and  nursing note reviewed.  Constitutional:      General: He is not in acute distress.    Appearance: He is well-developed. He is not diaphoretic.     Comments: Nontoxic appearing and in NAD  HENT:     Head: Normocephalic and atraumatic.  Eyes:     General: No scleral icterus.    Conjunctiva/sclera: Conjunctivae normal.  Neck:     Musculoskeletal: Normal range of motion.  Cardiovascular:     Rate and Rhythm: Normal rate and regular rhythm.     Pulses: Normal pulses.     Comments: Distal radial pulse 2+ bilaterally. Pulmonary:     Effort: Pulmonary effort is normal. No respiratory distress.     Comments: Respirations even and unlabored Musculoskeletal: Normal range of motion.     Comments: Normal ROM of bilateral hands without evidence of acute injury. No swelling, crepitus, deformity.  Skin:    General: Skin is warm and dry.     Coloration: Skin is not pale.     Findings: No  erythema or rash.  Neurological:     Mental Status: He is alert and oriented to person, place, and time.  Psychiatric:        Behavior: Behavior normal.      ED Treatments / Results  Labs (all labs ordered are listed, but only abnormal results are displayed) Labs Reviewed - No data to display  EKG None  Radiology Dg Hand Complete Left  Result Date: 11/19/2018 CLINICAL DATA:  Pain EXAM: LEFT HAND - COMPLETE 3+ VIEW COMPARISON:  None. FINDINGS: There is no evidence of fracture or dislocation. There is no evidence of arthropathy or other focal bone abnormality. Soft tissues are unremarkable. IMPRESSION: Negative. Electronically Signed   By: Constance Holster M.D.   On: 11/19/2018 03:37   Dg Hand Complete Right  Result Date: 11/19/2018 CLINICAL DATA:  Pain EXAM: RIGHT HAND - COMPLETE 3+ VIEW COMPARISON:  None. FINDINGS: There is no evidence of fracture or dislocation. There is no evidence of arthropathy or other focal bone abnormality. Soft tissues are unremarkable. IMPRESSION: Negative. Electronically Signed   By: Constance Holster M.D.   On: 11/19/2018 03:37    Procedures Procedures (including critical care time)  Medications Ordered in ED Medications  ibuprofen (ADVIL) tablet 800 mg (has no administration in time range)     Initial Impression / Assessment and Plan / ED Course  I have reviewed the triage vital signs and the nursing notes.  Pertinent labs & imaging results that were available during my care of the patient were reviewed by me and considered in my medical decision making (see chart for details).        Patient presents to the emergency department for evaluation of bilateral hand pain. Patient neurovascularly intact on exam. Imaging negative for fracture, dislocation, bony deformity. No swelling, erythema, heat to touch to the affected area. Plan for supportive management including RICE and NSAIDs; primary care follow up as needed. Return precautions  discussed and provided. Patient discharged in stable condition with no unaddressed concerns.   Final Clinical Impressions(s) / ED Diagnoses   Final diagnoses:  Contusion of hand, unspecified laterality, initial encounter    ED Discharge Orders    None       Antonietta Breach, PA-C 11/19/18 0404    Palumbo, April, MD 11/19/18 [phone]

## 2018-11-19 NOTE — ED Triage Notes (Addendum)
Patient hit wall with both hands. Patient is complaining of pain. Both hands are in pain. Patient has no cuts or no obvious bruises.

## 2018-11-19 NOTE — Discharge Instructions (Signed)
Ice your hand 3-4 times per day to limit inflammation.  Take 600 mg ibuprofen every 6 hours for management of ongoing pain.  You may follow-up with your primary care doctor as needed for ongoing symptoms.

## 2019-01-14 ENCOUNTER — Other Ambulatory Visit: Payer: Self-pay

## 2019-01-14 ENCOUNTER — Encounter (HOSPITAL_COMMUNITY): Payer: Self-pay | Admitting: Emergency Medicine

## 2019-01-14 ENCOUNTER — Emergency Department (HOSPITAL_COMMUNITY)
Admission: EM | Admit: 2019-01-14 | Discharge: 2019-01-14 | Disposition: A | Discharge status: Home / Self Care | Payer: BC Managed Care – PPO | Attending: Emergency Medicine | Admitting: Emergency Medicine

## 2019-01-14 DIAGNOSIS — R509 Fever, unspecified: Secondary | ICD-10-CM | POA: Diagnosis not present

## 2019-01-14 DIAGNOSIS — Z5321 Procedure and treatment not carried out due to patient leaving prior to being seen by health care provider: Secondary | ICD-10-CM | POA: Diagnosis not present

## 2019-01-14 MED ORDER — ACETAMINOPHEN 325 MG PO TABS
650.0000 mg | ORAL_TABLET | Freq: Once | ORAL | Status: AC | PRN
  Administered 2019-01-14: 650 mg via ORAL
  Filled 2019-01-14: qty 2

## 2019-01-14 NOTE — ED Triage Notes (Signed)
Per pt, states he has been running a fever for a few days-has not taking anything for elevated tempeture-states also having a headache and fatigue

## 2020-02-28 IMAGING — DX PORTABLE CHEST - 1 VIEW
1 series · 1 of 1 positions shown · non-contrast
Comparison: 07/13/2017

CLINICAL DATA: Chest pain

EXAM:
PORTABLE CHEST 1 VIEW

[chest ap]
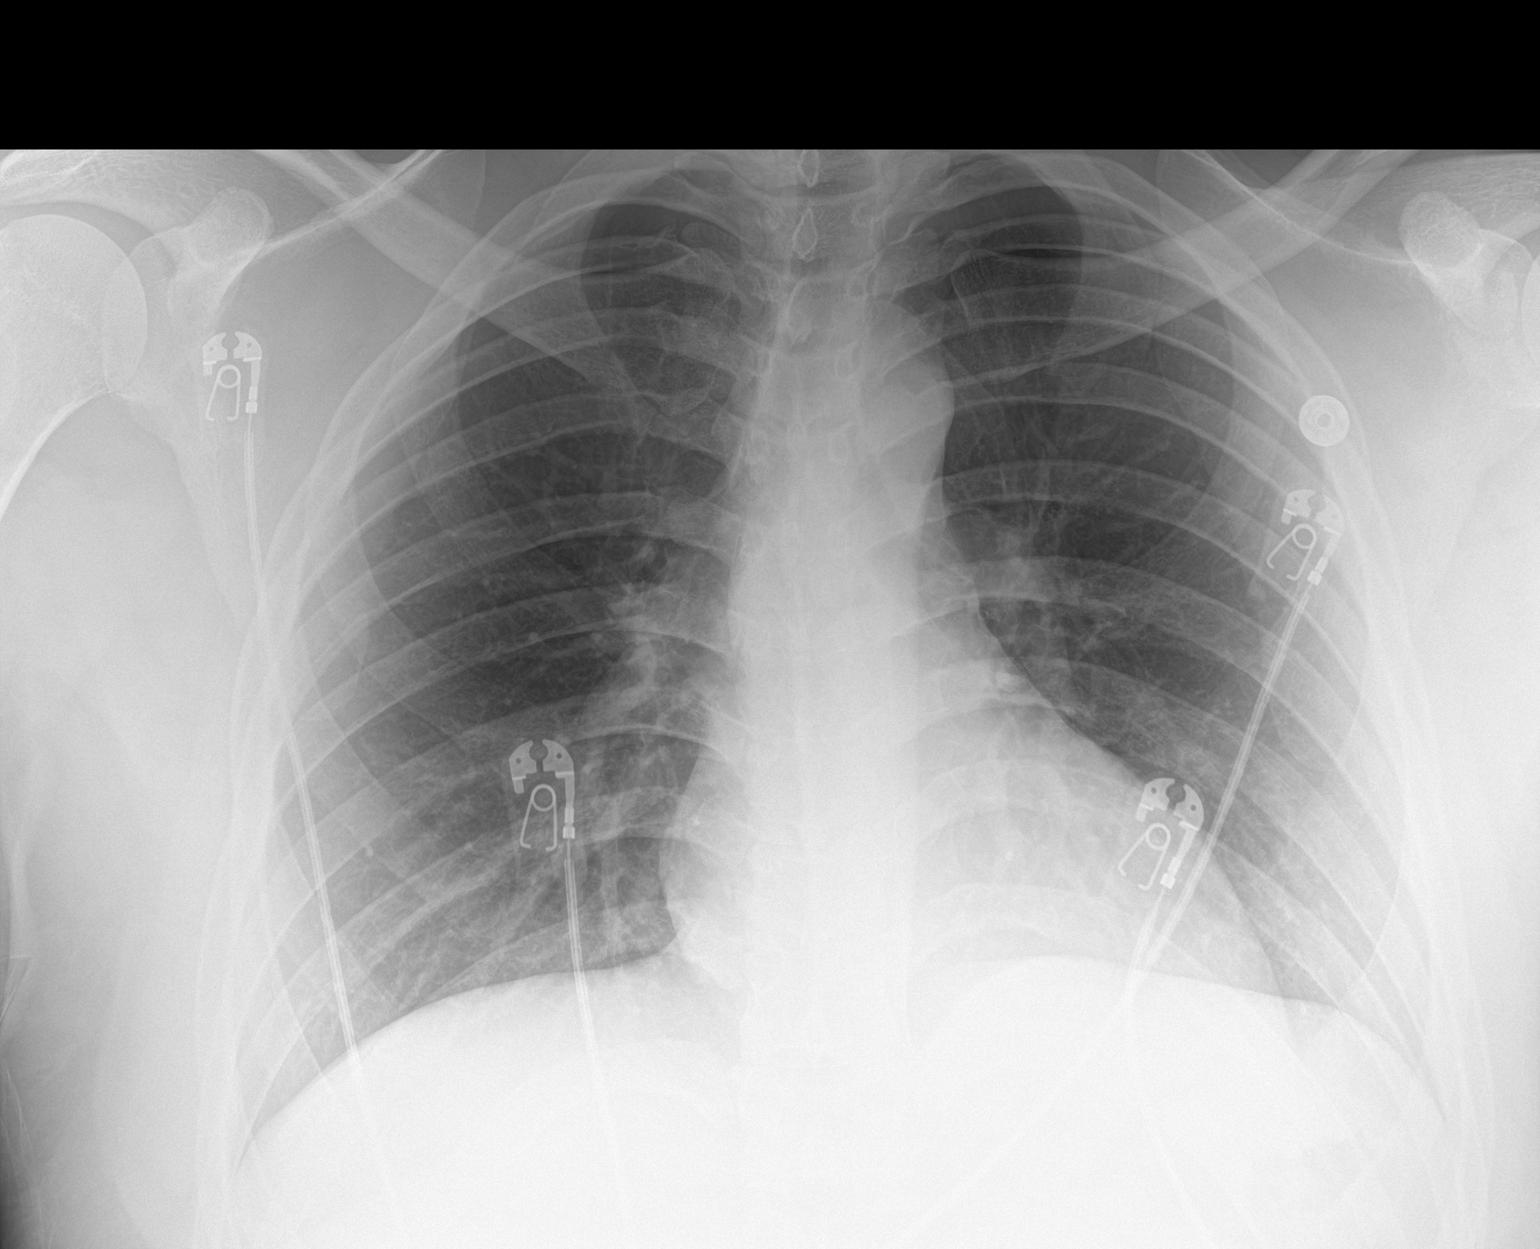

[1 of 1 positions shown; findings below may reference images not displayed]

FINDINGS: Heart and mediastinal contours are within normal limits. No focal
opacities or effusions. No acute bony abnormality.
IMPRESSION: No active disease.

## 2020-04-05 IMAGING — DX CHEST - 2 VIEW
2 series · 2 of 2 positions shown · non-contrast
Comparison: 04/23/2018 chest radiograph

CLINICAL DATA: 23 y/o M; intermittent generalized pain across the
chest with shortness of breath for 2 months.

EXAM:
CHEST - 2 VIEW

[chest pa]
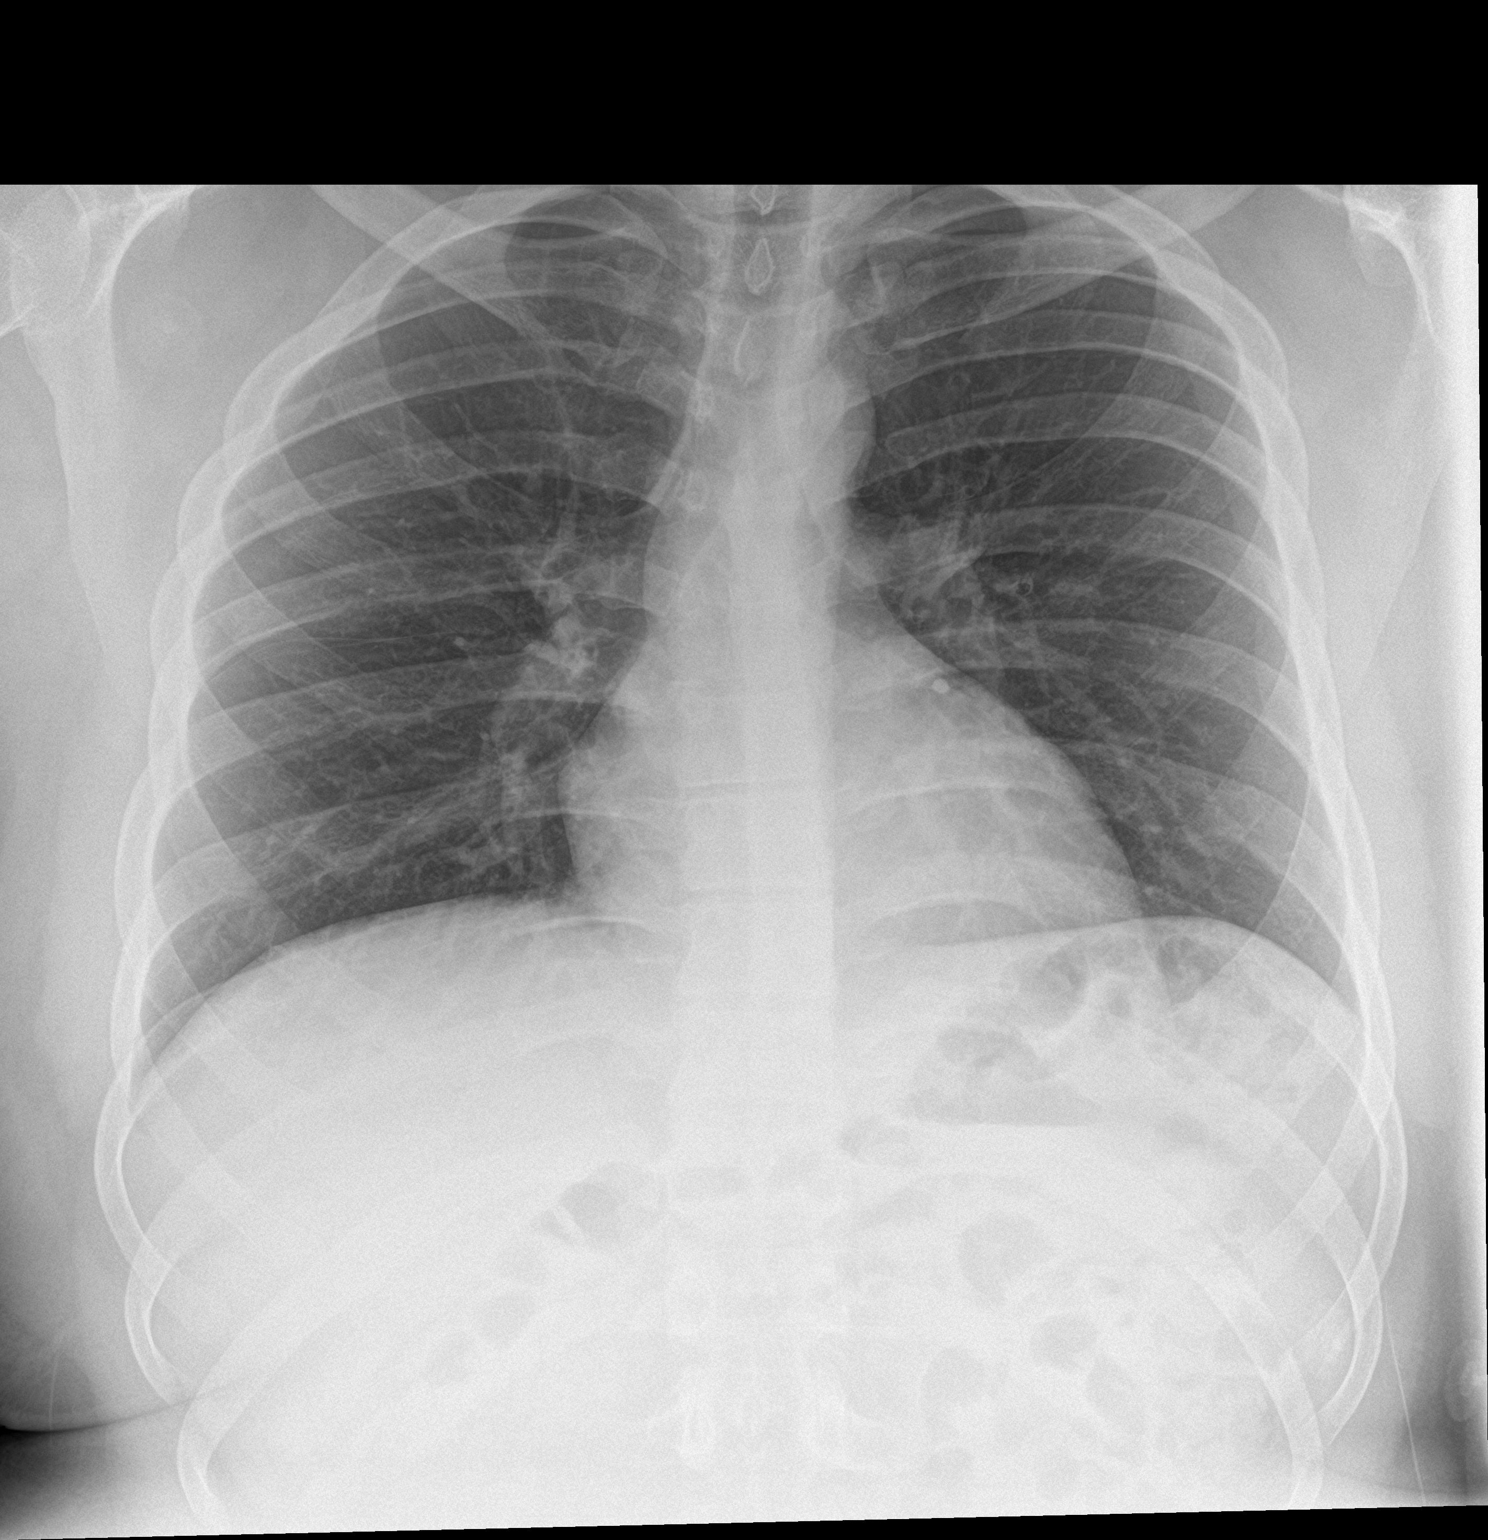

[chest lat]
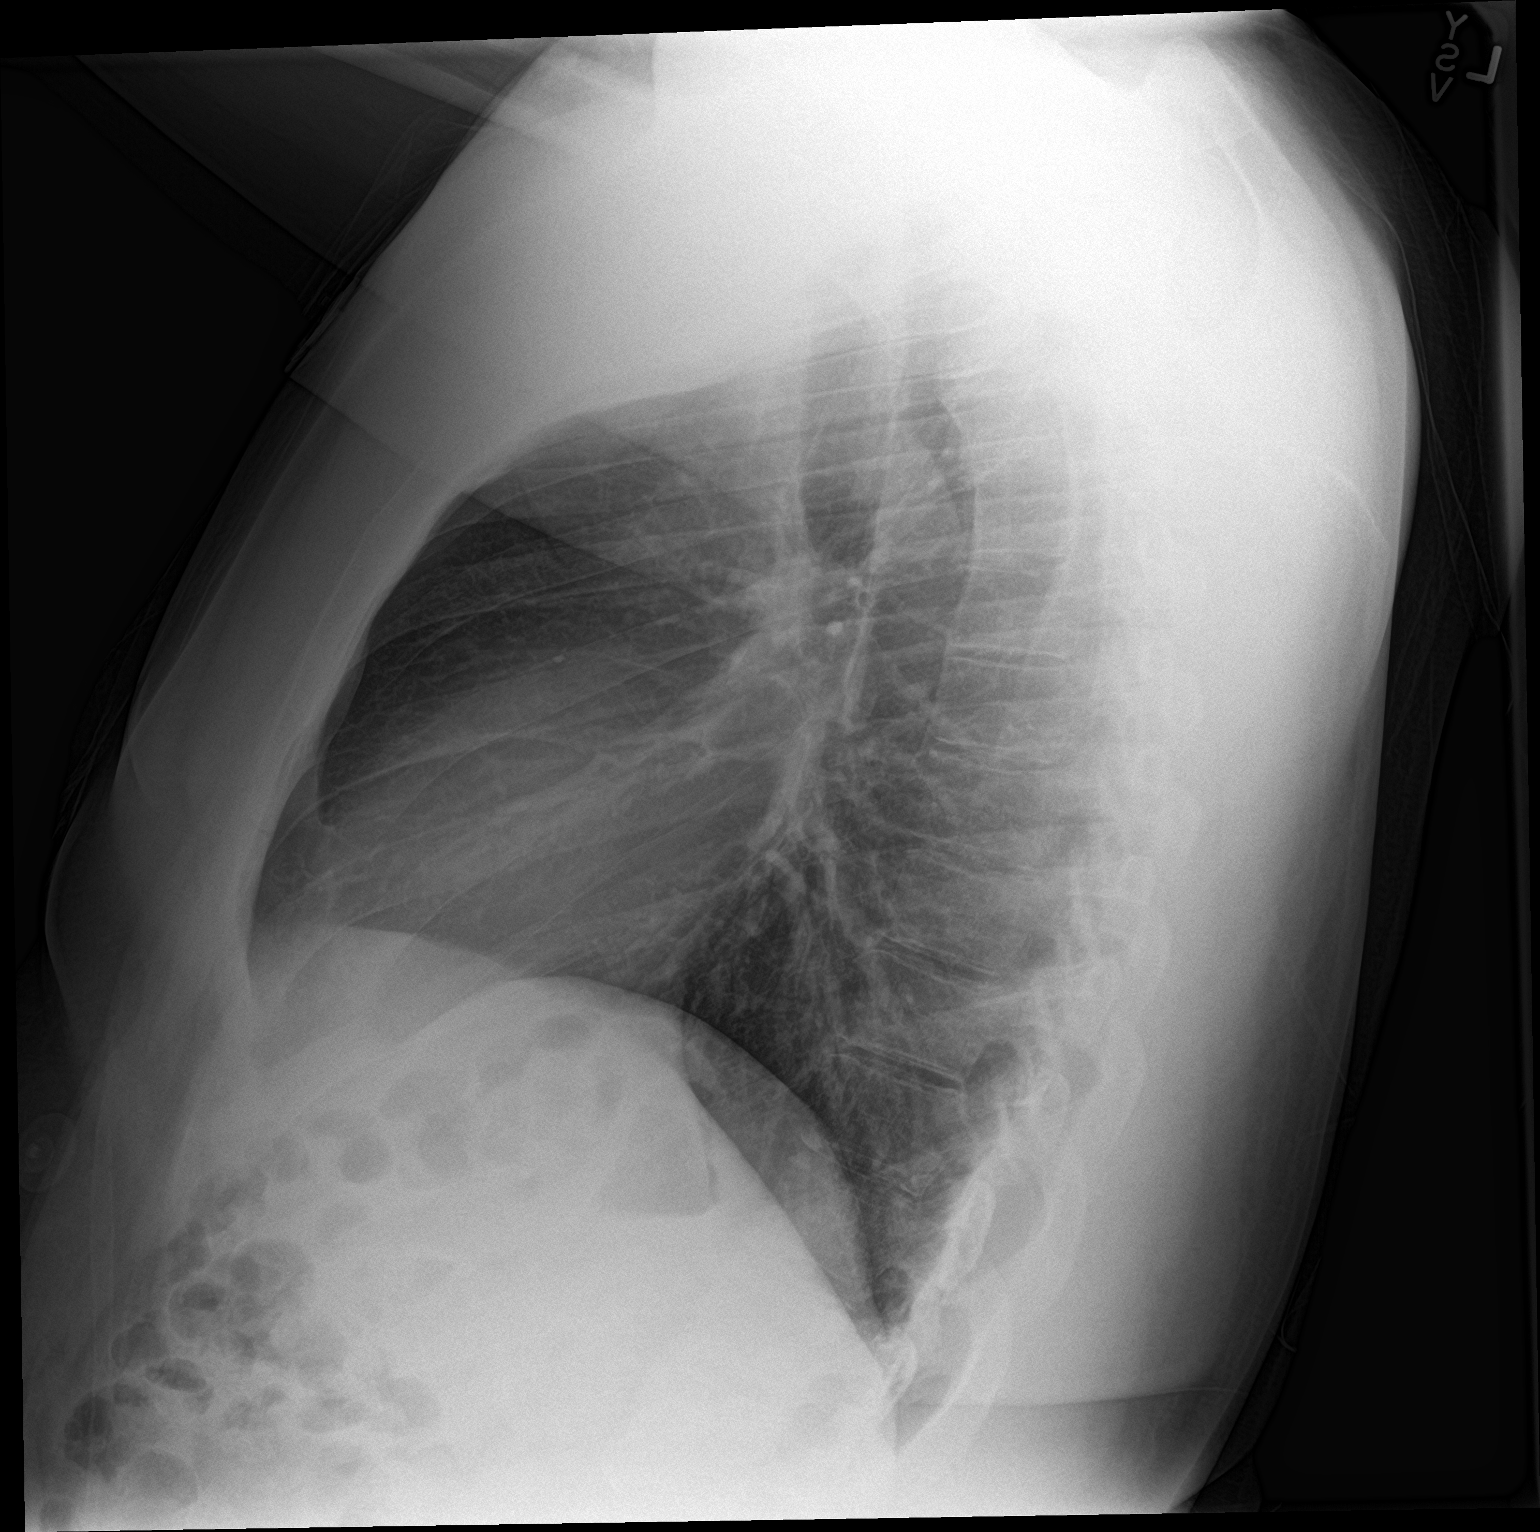

[2 of 2 positions shown; findings below may reference images not displayed]

FINDINGS: Stable heart size and mediastinal contours are within normal limits.
Both lungs are clear. The visualized skeletal structures are
unremarkable.
IMPRESSION: No acute pulmonary process identified.

## 2021-03-10 ENCOUNTER — Encounter (HOSPITAL_COMMUNITY): Payer: Self-pay | Admitting: Emergency Medicine

## 2021-03-10 ENCOUNTER — Emergency Department (HOSPITAL_COMMUNITY)
Admission: EM | Admit: 2021-03-10 | Discharge: 2021-03-10 | Disposition: A | Discharge status: Home / Self Care | Payer: BC Managed Care – PPO | Attending: Emergency Medicine | Admitting: Emergency Medicine

## 2021-03-10 DIAGNOSIS — R519 Headache, unspecified: Secondary | ICD-10-CM | POA: Insufficient documentation

## 2021-03-10 NOTE — Discharge Instructions (Signed)
You have been evaluated for your recurrent left-sided headache.  There are many things that can cause headache but fortunately you have symptoms not consistent with a life-threatening headache.  Your headache may be due to undiagnosed migraine headache.  It is important for you to follow-up with a neurologist for further assessment and treatment.  Return to the ER if you have any concern.

## 2021-03-10 NOTE — ED Triage Notes (Signed)
Patient c/o headache described as dull and pressure since September.

## 2021-03-10 NOTE — ED Provider Notes (Signed)
[facility]-EMERGENCY DEPT Provider Note   CSN: [id number] Arrival date & time: 03/10/21  1103     History  Chief Complaint  Patient presents with   Headache    Brett Taylor is a 26 y.o. male.  The history is provided by the patient. No language interpreter was used.  Headache  26 year old male with significant history of ADHD who presenting with complaints of headache.  Patient report recurrent headache for nearly a year.  States headache is usually noted to the left side of his head, described as a pressure uncomfortable sensation usually lasting for less than an hour but does manifest almost on a daily basis.  Headache is not associated with vision changes no fever no neck stiffness no nausea vomiting diarrhea light or sound sensitivity.  He is here at the recommendation of his family member due to his recurrent headache.  He does admits to having poor eyesight but does not always wear his prescription glasses.  He normally has noticed some change in his headache with temperature changes.  He does not have history of migraine.  Headache is mild at this time  Home Medications Prior to Admission medications   Medication Sig Start Date End Date Taking? Authorizing Provider  albuterol (PROAIR HFA) 108 (90 Base) MCG/ACT inhaler Inhale 1-2 puffs into the lungs every 6 (six) hours as needed for wheezing or shortness of breath. 04/18/18   Georgetta Haber, NP  cetirizine (ZYRTEC) 10 MG tablet Take 1 tablet (10 mg total) by mouth daily. 04/18/18   Georgetta Haber, NP  omeprazole (PRILOSEC) 20 MG capsule Take 1 capsule (20 mg total) by mouth daily. 04/18/18   Georgetta Haber, NP      Allergies    Patient has no known allergies.    Review of Systems   Review of Systems  Neurological:  Positive for headaches.  All other systems reviewed and are negative.  Physical Exam Updated Vital Signs Pulse 89    Temp 98 F (36.7 C)    Resp 16    SpO2 96%  Physical Exam Vitals  and nursing note reviewed.  Constitutional:      General: He is not in acute distress.    Appearance: He is well-developed.  HENT:     Head: Normocephalic and atraumatic.  Eyes:     Extraocular Movements: Extraocular movements intact.     Conjunctiva/sclera: Conjunctivae normal.     Pupils: Pupils are equal, round, and reactive to light.  Cardiovascular:     Rate and Rhythm: Normal rate and regular rhythm.     Heart sounds: Normal heart sounds.  Pulmonary:     Effort: Pulmonary effort is normal.     Breath sounds: Normal breath sounds.  Abdominal:     Palpations: Abdomen is soft.  Musculoskeletal:     Cervical back: Normal range of motion and neck supple.  Skin:    Findings: No rash.  Neurological:     Mental Status: He is alert and oriented to person, place, and time.     GCS: GCS eye subscore is 4. GCS verbal subscore is 5. GCS motor subscore is 6.     Cranial Nerves: No cranial nerve deficit.     Sensory: No sensory deficit.     Motor: Motor function is intact.     Coordination: Coordination is intact.     Gait: Gait is intact.  Psychiatric:        Mood and Affect: Mood normal.  ED Results / Procedures / Treatments   Labs (all labs ordered are listed, but only abnormal results are displayed) Labs Reviewed - No data to display  EKG None  Radiology No results found.  Procedures Procedures    Medications Ordered in ED Medications - No data to display  ED Course/ Medical Decision Making/ A&P                           Medical Decision Making  BP 138/75 (BP Location: Left Arm)    Pulse 89    Temp 98 F (36.7 C)    Resp 16    SpO2 96%    11:28 AM This is a generally healthy 26 year old male presenting with recurrent headache.  He described left-sided headache which has been ongoing issue on on almost a daily basis for nearly a year.  He is here per request of his family member.  He endorsed headache lasting minutes but usually less than an hour.  Sometimes  weather changes may attribute to his headache.  He also endorsed having poor eyesight but does not always wear prescription glasses.  On exam, patient is overall well-appearing, vital signs stable, no focal neurodeficit on exam.  He does not have any red flags concerning for space-occupying lesion, stroke, subarachnoid hemorrhage, or meningitis.  This could be undiagnosed migraine given the recurrence of his symptoms.  Occasional light and sound sensitivity.  However, I do not think advanced imaging is indicated at this time and patient agrees.  Recommend patient to follow-up outpatient with neurology for further care.  Return precaution discussed.        Final Clinical Impression(s) / ED Diagnoses Final diagnoses:  Recurrent headache    Rx / DC Orders ED Discharge Orders     None         Fayrene Helper, PA-C 03/10/21 1134    Virgina Norfolk, DO 03/10/21 1138

## 2021-03-28 DIAGNOSIS — H04123 Dry eye syndrome of bilateral lacrimal glands: Secondary | ICD-10-CM | POA: Diagnosis not present

## 2021-03-28 DIAGNOSIS — H52223 Regular astigmatism, bilateral: Secondary | ICD-10-CM | POA: Diagnosis not present

## 2021-03-28 DIAGNOSIS — H5213 Myopia, bilateral: Secondary | ICD-10-CM | POA: Diagnosis not present

## 2021-04-04 ENCOUNTER — Encounter: Payer: Self-pay | Admitting: Family Medicine

## 2021-04-04 ENCOUNTER — Other Ambulatory Visit: Payer: Self-pay

## 2021-04-04 ENCOUNTER — Ambulatory Visit (INDEPENDENT_AMBULATORY_CARE_PROVIDER_SITE_OTHER): Payer: BC Managed Care – PPO | Admitting: Family Medicine

## 2021-04-04 VITALS — BP 120/92 | HR 81 | Temp 97.1°F | Ht 77.0 in | Wt 249.4 lb

## 2021-04-04 DIAGNOSIS — Z1159 Encounter for screening for other viral diseases: Secondary | ICD-10-CM | POA: Diagnosis not present

## 2021-04-04 DIAGNOSIS — F419 Anxiety disorder, unspecified: Secondary | ICD-10-CM | POA: Diagnosis not present

## 2021-04-04 DIAGNOSIS — Z114 Encounter for screening for human immunodeficiency virus [HIV]: Secondary | ICD-10-CM | POA: Diagnosis not present

## 2021-04-04 DIAGNOSIS — R519 Headache, unspecified: Secondary | ICD-10-CM

## 2021-04-04 DIAGNOSIS — Z0000 Encounter for general adult medical examination without abnormal findings: Secondary | ICD-10-CM

## 2021-04-04 LAB — CBC WITH DIFFERENTIAL/PLATELET
Basophils Absolute: 0 10*3/uL (ref 0.0–0.1)
Basophils Relative: 0.6 % (ref 0.0–3.0)
Eosinophils Absolute: 0.1 10*3/uL (ref 0.0–0.7)
Eosinophils Relative: 2.9 % (ref 0.0–5.0)
HCT: 44.8 % (ref 39.0–52.0)
Hemoglobin: 14.9 g/dL (ref 13.0–17.0)
Lymphocytes Relative: 45.5 % (ref 12.0–46.0)
Lymphs Abs: 1.6 10*3/uL (ref 0.7–4.0)
MCHC: 33.3 g/dL (ref 30.0–36.0)
MCV: 86.5 fl (ref 78.0–100.0)
Monocytes Absolute: 0.3 10*3/uL (ref 0.1–1.0)
Monocytes Relative: 8.6 % (ref 3.0–12.0)
Neutro Abs: 1.5 10*3/uL (ref 1.4–7.7)
Neutrophils Relative %: 42.4 % — ABNORMAL LOW (ref 43.0–77.0)
Platelets: 226 10*3/uL (ref 150.0–400.0)
RBC: 5.18 Mil/uL (ref 4.22–5.81)
RDW: 13.5 % (ref 11.5–15.5)
WBC: 3.6 10*3/uL — ABNORMAL LOW (ref 4.0–10.5)

## 2021-04-04 LAB — COMPREHENSIVE METABOLIC PANEL
ALT: 16 U/L (ref 0–53)
AST: 19 U/L (ref 0–37)
Albumin: 4.9 g/dL (ref 3.5–5.2)
Alkaline Phosphatase: 59 U/L (ref 39–117)
BUN: 18 mg/dL (ref 6–23)
CO2: 29 mEq/L (ref 19–32)
Calcium: 9.6 mg/dL (ref 8.4–10.5)
Chloride: 107 mEq/L (ref 96–112)
Creatinine, Ser: 1.18 mg/dL (ref 0.40–1.50)
GFR: 85.51 mL/min (ref >60.00)
Glucose, Bld: 82 mg/dL (ref 70–99)
Potassium: 4.2 mEq/L (ref 3.5–5.1)
Sodium: 140 mEq/L (ref 135–145)
Total Bilirubin: 0.7 mg/dL (ref 0.2–1.2)
Total Protein: 7.3 g/dL (ref 6.0–8.3)

## 2021-04-04 LAB — URINALYSIS
Bilirubin Urine: NEGATIVE
Hgb urine dipstick: NEGATIVE
Ketones, ur: NEGATIVE
Leukocytes,Ua: NEGATIVE
Nitrite: NEGATIVE
Specific Gravity, Urine: 1.025 (ref 1.000–1.030)
Total Protein, Urine: NEGATIVE
Urine Glucose: NEGATIVE
Urobilinogen, UA: 0.2 (ref 0.0–1.0)
pH: 5.5 (ref 5.0–8.0)

## 2021-04-04 LAB — LIPID PANEL
Cholesterol: 178 mg/dL (ref 0–200)
HDL: 47.1 mg/dL (ref >39.00)
LDL Cholesterol: 112 mg/dL — ABNORMAL HIGH (ref 0–99)
NonHDL: 130.59
Total CHOL/HDL Ratio: 4
Triglycerides: 92 mg/dL (ref 0.0–149.0)
VLDL: 18.4 mg/dL (ref 0.0–40.0)

## 2021-04-04 LAB — HEMOGLOBIN A1C: Hgb A1c MFr Bld: 5.4 % (ref 4.6–6.5)

## 2021-04-04 MED ORDER — AMITRIPTYLINE HCL 25 MG PO TABS: 25.0000 mg | ORAL_TABLET | Freq: Every day | ORAL | 2 refills | Status: DC

## 2021-04-04 MED ORDER — VENLAFAXINE HCL ER 75 MG PO CP24: 75.0000 mg | ORAL_CAPSULE | Freq: Every day | ORAL | 2 refills | Status: DC

## 2021-04-04 NOTE — Progress Notes (Signed)
? ?Established Patient Office Visit ? ?Subjective:  ?Patient ID: Brett Taylor, male    DOB: 1995-03-24  Age: 26 y.o. MRN: [id number] ? ?CC:  ?Chief Complaint  ?Patient presents with  ? Establish Care  ?  Np establish care. Pt C/o extreme frenquent headaches w/ tingling sensation left side chest and left arm x 6 months.  ? ? ?HPI ?Brett Taylor presents for establishment of care.  He is fasting this morning.  He reports a history of nonprogressive headaches that may occur on either side of his head.  There are no prodromal symptoms.  There is no blurred vision nausea or difficulty swallowing.  He has no history of headaches.  There is occasional photophobia.  Headaches may last 5 minutes.  They are coming daily.  There can be a tingling sensation in his head and left shoulder.  History of depression anxiety and attention deficit.  Normal CT of head in 2019.  He lives at home with his mom.  She is healthy other than being a history of hypertension.  He works at [employer].  He walks for 30 minutes daily.  He smokes marijuana on occasion. ? ?Past Medical History:  ?Diagnosis Date  ? ADHD (attention deficit hyperactivity disorder)   ? Anxiety   ? Depression   ? History of heart murmur in childhood   ? Wears glasses   ? ? ?Past Surgical History:  ?Procedure Laterality Date  ? NO PAST SURGERIES    ? TONSILLECTOMY    ? ? ?Family History  ?Problem Relation Age of Onset  ? Hypertension Mother   ? Cancer Maternal Grandmother   ?     breast  ? Heart disease Maternal Grandfather   ? Diabetes Other   ?     maternal side  ? Stroke Neg Hx   ? ? ?Social History  ? ?Socioeconomic History  ? Marital status: Single  ?  Spouse name: Not on file  ? Number of children: Not on file  ? Years of education: 33  ? Highest education level: Not on file  ?Occupational History  ? Not on file  ?Tobacco Use  ? Smoking status: Former  ?  Packs/day: 0.25  ?  Years: 4.00  ?  Pack years: 1.00  ?  Types: Cigarettes  ? Smokeless tobacco: Never  ?  Tobacco comments:  ?  03/25/2016 "smoked a black right before I came in"  ?Vaping Use  ? Vaping Use: Never used  ?Substance and Sexual Activity  ? Alcohol use: Not Currently  ? Drug use: Not Currently  ?  Types: Marijuana  ? Sexual activity: Yes  ?Other Topics Concern  ? Not on file  ?Social History Narrative  ? 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  [employer] for high school.   No college currently.   Exercise -active on the job, lifts boxes up to 50lb, lots of walking, goes to the gym 3-4 times per week.  No current relatioship  ? ?Social Determinants of Health  ? ?Financial Resource Strain: Not on file  ?Food Insecurity: Not on file  ?Transportation Needs: Not on file  ?Physical Activity: Not on file  ?Stress: Not on file  ?Social Connections: Not on file  ?Intimate Partner Violence: Not on file  ? ? ?Outpatient Medications Prior to Visit  ?Medication Sig Dispense Refill  ? albuterol (PROAIR HFA) 108 (90 Base) MCG/ACT inhaler Inhale 1-2 puffs into  the lungs every 6 (six) hours as needed for wheezing or shortness of breath. 1 Inhaler 0  ? cetirizine (ZYRTEC) 10 MG tablet Take 1 tablet (10 mg total) by mouth daily. 30 tablet 0  ? omeprazole (PRILOSEC) 20 MG capsule Take 1 capsule (20 mg total) by mouth daily. 30 capsule 0  ? ?No facility-administered medications prior to visit.  ? ? ?No Known Allergies ? ?ROS ?Review of Systems  ?Constitutional:  Negative for chills, diaphoresis, fatigue, fever and unexpected weight change.  ?HENT:  Positive for congestion. Negative for postnasal drip, rhinorrhea, sinus pressure and sinus pain.   ?Eyes:  Negative for photophobia and visual disturbance.  ?Respiratory: Negative.  Negative for chest tightness, shortness of breath and wheezing.   ?Cardiovascular:  Negative for chest pain and palpitations.  ?Gastrointestinal:  Negative for diarrhea and vomiting.  ?Genitourinary: Negative.   ?Neurological:  Positive for  headaches. Negative for speech difficulty, weakness, light-headedness and numbness.  ? ?  ? ?  04/04/2021  ? 10:25 AM 07/18/2016  ?  1:10 PM  ?Depression screen PHQ 2/9  ?Decreased Interest 0   ?Down, Depressed, Hopeless 0   ?PHQ - 2 Score 0   ?Altered sleeping 0   ?Tired, decreased energy 3   ?Feeling bad or failure about yourself  2   ?Trouble concentrating 1   ?Moving slowly or fidgety/restless 0   ?Suicidal thoughts 0   ?PHQ-9 Score 6   ?  ? Information is confidential and restricted. Go to Review Flowsheets to unlock data.  ? ? ? ?Objective:  ?  ?Physical Exam ?Vitals and nursing note reviewed.  ?Constitutional:   ?   General: He is not in acute distress. ?   Appearance: Normal appearance. He is not ill-appearing, toxic-appearing or diaphoretic.  ?HENT:  ?   Head: Normocephalic and atraumatic.  ?   Right Ear: Tympanic membrane, ear canal and external ear normal.  ?   Left Ear: Tympanic membrane, ear canal and external ear normal.  ?   Mouth/Throat:  ?   Mouth: Mucous membranes are moist.  ?   Pharynx: Oropharynx is clear. No oropharyngeal exudate or posterior oropharyngeal erythema.  ?Eyes:  ?   General: No visual field deficit or scleral icterus.    ?   Right eye: No discharge.     ?   Left eye: No discharge.  ?   Extraocular Movements: Extraocular movements intact.  ?   Conjunctiva/sclera: Conjunctivae normal.  ?   Pupils: Pupils are equal, round, and reactive to light.  ?Neck:  ?   Vascular: No carotid bruit.  ?Cardiovascular:  ?   Rate and Rhythm: Normal rate and regular rhythm.  ?Pulmonary:  ?   Effort: Pulmonary effort is normal.  ?   Breath sounds: Normal breath sounds.  ?Abdominal:  ?   General: Bowel sounds are normal.  ?Musculoskeletal:  ?   Cervical back: No rigidity or tenderness.  ?Lymphadenopathy:  ?   Cervical: No cervical adenopathy.  ?Skin: ?   General: Skin is warm and dry.  ?Neurological:  ?   Mental Status: He is alert and oriented to person, place, and time.  ?   Cranial Nerves: No cranial  nerve deficit, dysarthria or facial asymmetry.  ?Psychiatric:     ?   Mood and Affect: Mood normal.     ?   Behavior: Behavior normal.  ? ? ?BP (!) 120/92 (BP Location: Left Arm, Patient Position: Sitting, Cuff Size: Normal)   Pulse  81   Temp (!) 97.1 ?F (36.2 ?C) (Temporal)   Ht 6\' 5"  (1.956 m)   Wt 249 lb 6.4 oz (113.1 kg)   SpO2 97%   BMI 29.57 kg/m?  ?Wt Readings from Last 3 Encounters:  ?04/04/21 249 lb 6.4 oz (113.1 kg)  ?11/19/18 264 lb 8.8 oz (120 kg)  ?07/13/17 250 lb (113.4 kg)  ? ? ? ?Health Maintenance Due  ?Topic Date Due  ? Hepatitis C Screening  Never done  ? ? ?There are no preventive care reminders to display for this patient. ? ? ?Lab Results  ?Component Value Date  ? TSH 0.72 07/17/2017  ? ?Lab Results  ?Component Value Date  ? WBC 6.1 05/30/2018  ? HGB 14.1 05/30/2018  ? HCT 42.5 05/30/2018  ? MCV 84.5 05/30/2018  ? PLT 257 05/30/2018  ? ?Lab Results  ?Component Value Date  ? NA 140 05/30/2018  ? K 3.4 (L) 05/30/2018  ? CO2 21 (L) 05/30/2018  ? GLUCOSE 103 (H) 05/30/2018  ? BUN 11 05/30/2018  ? CREATININE 1.08 05/30/2018  ? BILITOT 0.4 07/17/2017  ? ALKPHOS 60 07/17/2017  ? AST 14 07/17/2017  ? ALT 12 07/17/2017  ? PROT 7.5 07/17/2017  ? ALBUMIN 4.3 07/17/2017  ? CALCIUM 9.6 05/30/2018  ? ANIONGAP 8 05/30/2018  ? GFR 98.12 07/17/2017  ? ?Lab Results  ?Component Value Date  ? CHOL 136 07/17/2017  ? ?Lab Results  ?Component Value Date  ? HDL 31.40 (L) 07/17/2017  ? ?Lab Results  ?Component Value Date  ? LDLCALC 88 07/17/2017  ? ?Lab Results  ?Component Value Date  ? TRIG 87.0 07/17/2017  ? ?Lab Results  ?Component Value Date  ? CHOLHDL 4 07/17/2017  ? ?Lab Results  ?Component Value Date  ? HGBA1C 5.4 05/14/2015  ? ? ?  ?Assessment & Plan:  ? ?Problem List Items Addressed This Visit   ? ?  ? Other  ? Healthcare maintenance - Primary  ? Relevant Orders  ? CBC with Differential/Platelet  ? Comprehensive metabolic panel  ? Hemoglobin A1c  ? Lipid panel  ? Urinalysis  ? Nonintractable episodic  headache  ? Relevant Medications  ? venlafaxine XR (EFFEXOR XR) 75 MG 24 hr capsule  ? amitriptyline (ELAVIL) 25 MG tablet  ? Anxiety  ? Relevant Medications  ? venlafaxine XR (EFFEXOR XR) 75 MG 24 hr capsule  ? amitrip

## 2021-04-05 LAB — HEPATITIS C ANTIBODY
Hepatitis C Ab: NONREACTIVE
SIGNAL TO CUT-OFF: 0.05 (ref <1.00)

## 2021-04-05 LAB — HIV ANTIBODY (ROUTINE TESTING W REFLEX): HIV 1&2 Ab, 4th Generation: NONREACTIVE

## 2021-04-22 NOTE — Progress Notes (Signed)
Error message

## 2021-04-29 ENCOUNTER — Encounter: Payer: Self-pay | Admitting: Family Medicine

## 2021-04-29 ENCOUNTER — Ambulatory Visit (INDEPENDENT_AMBULATORY_CARE_PROVIDER_SITE_OTHER): Payer: BC Managed Care – PPO | Admitting: Family Medicine

## 2021-04-29 VITALS — BP 132/78 | HR 72 | Temp 97.6°F | Ht 77.0 in | Wt 257.4 lb

## 2021-04-29 DIAGNOSIS — R519 Headache, unspecified: Secondary | ICD-10-CM

## 2021-04-29 DIAGNOSIS — F419 Anxiety disorder, unspecified: Secondary | ICD-10-CM | POA: Diagnosis not present

## 2021-04-29 DIAGNOSIS — J301 Allergic rhinitis due to pollen: Secondary | ICD-10-CM

## 2021-04-29 MED ORDER — PREDNISONE 20 MG PO TABS: 20.0000 mg | ORAL_TABLET | Freq: Two times a day (BID) | ORAL | 0 refills | Status: AC

## 2021-04-29 MED ORDER — MOMETASONE FUROATE 50 MCG/ACT NA SUSP: 2.0000 | Freq: Every day | NASAL | 12 refills | Status: DC

## 2021-04-29 NOTE — Progress Notes (Signed)
? ?Established Patient Office Visit ? ?Subjective:  ?Patient ID: Brett Taylor, male    DOB: 09/02/1995  Age: [age] y.o. MRN: [id number] ? ?CC:  ?Chief Complaint  ?Patient presents with  ? Headache  ?  Still having consistent headaches. Allergies bothering him.   ? ? ?HPI ?Brett BlazeErnest U Liwanag presents for follow-up of headaches and anxiety.  He has not had a chance to start the venlafaxine and Elavil.  He is now reporting sneezing, scratchy throat, runny nose and above-mentioned headache.  He has had similar symptoms to these in the past in the springtime.  He is currently not taking any medications for them.  His headache history is preceded his allergy symptoms.  Normal CT of head in 2020.  He is accompanied by his grandmother today. ? ?Past Medical History:  ?Diagnosis Date  ? ADHD (attention deficit hyperactivity disorder)   ? Anxiety   ? Depression   ? History of heart murmur in childhood   ? Wears glasses   ? ? ?Past Surgical History:  ?Procedure Laterality Date  ? NO PAST SURGERIES    ? TONSILLECTOMY    ? ? ?Family History  ?Problem Relation Age of Onset  ? Hypertension Mother   ? Cancer Maternal Grandmother   ?     breast  ? Heart disease Maternal Grandfather   ? Diabetes Other   ?     maternal side  ? Stroke Neg Hx   ? ? ?Social History  ? ?Socioeconomic History  ? Marital status: Single  ?  Spouse name: Not on file  ? Number of children: Not on file  ? Years of education: 4012  ? Highest education level: Not on file  ?Occupational History  ? Not on file  ?Tobacco Use  ? Smoking status: Former  ?  Packs/day: 0.25  ?  Years: 4.00  ?  Pack years: 1.00  ?  Types: Cigarettes  ? Smokeless tobacco: Never  ? Tobacco comments:  ?  03/25/2016 "smoked a black right before I came in"  ?Vaping Use  ? Vaping Use: Never used  ?Substance and Sexual Activity  ? Alcohol use: Not Currently  ? Drug use: Not Currently  ?  Types: Marijuana  ? Sexual activity: Yes  ?Other Topics Concern  ? Not on file  ?Social History Narrative  ? 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  [employer] for [employer].   No college currently.   Exercise -active on the job, lifts boxes up to 50lb, lots of walking, goes to the gym 3-4 times per week.  No current relatioship  ? ?Social Determinants of Health  ? ?Financial Resource Strain: Not on file  ?Food Insecurity: Not on file  ?Transportation Needs: Not on file  ?Physical Activity: Not on file  ?Stress: Not on file  ?Social Connections: Not on file  ?Intimate Partner Violence: Not on file  ? ? ?Outpatient Medications Prior to Visit  ?Medication Sig Dispense Refill  ? amitriptyline (ELAVIL) 25 MG tablet Take 1 tablet (25 mg total) by mouth at bedtime. (Patient not taking: Reported on 04/29/2021) 30 tablet 2  ? venlafaxine XR (EFFEXOR XR) 75 MG 24 hr capsule Take 1 capsule (75 mg total) by mouth daily with breakfast. (Patient not taking: Reported on 04/29/2021) 30 capsule 2  ? ?No facility-administered medications prior to visit.  ? ? ?No Known Allergies ? ?ROS ?Review of Systems  ?Constitutional:  Negative for diaphoresis, fatigue, fever and unexpected weight change.  ?HENT:  Positive for congestion, postnasal drip, rhinorrhea and sneezing.   ?Eyes:  Negative for photophobia and visual disturbance.  ?Respiratory:  Negative for chest tightness, shortness of breath and wheezing.   ?Cardiovascular:  Negative for chest pain.  ?Gastrointestinal:  Negative for abdominal distention, nausea and vomiting.  ?Endocrine: Negative for polyphagia.  ?Genitourinary:  Negative for decreased urine volume, difficulty urinating, frequency and urgency.  ?Musculoskeletal:  Negative for gait problem and joint swelling.  ?Neurological:  Positive for headaches. Negative for speech difficulty, weakness and light-headedness.  ? ?  ?Objective:  ?  ?Physical Exam ?Vitals and nursing note reviewed.  ?Constitutional:   ?   General: He is not in acute distress. ?   Appearance: Normal  appearance. He is well-developed. He is not ill-appearing, toxic-appearing or diaphoretic.  ?HENT:  ?   Head: Normocephalic and atraumatic.  ?   Right Ear: External ear normal.  ?   Left Ear: External ear normal.  ?   Mouth/Throat:  ?   Mouth: Mucous membranes are moist.  ?   Pharynx: Oropharynx is clear. No oropharyngeal exudate or posterior oropharyngeal erythema.  ?Eyes:  ?   Extraocular Movements: Extraocular movements intact.  ?   Conjunctiva/sclera: Conjunctivae normal.  ?   Pupils: Pupils are equal, round, and reactive to light.  ?Neck:  ?   Vascular: No carotid bruit.  ?Cardiovascular:  ?   Rate and Rhythm: Normal rate and regular rhythm.  ?Pulmonary:  ?   Effort: Pulmonary effort is normal.  ?   Breath sounds: Normal breath sounds. No wheezing.  ?Abdominal:  ?   General: Bowel sounds are normal. There is no distension.  ?   Palpations: There is no mass.  ?   Tenderness: There is no abdominal tenderness. There is no guarding or rebound.  ?   Hernia: No hernia is present.  ?Musculoskeletal:  ?   Cervical back: No rigidity or tenderness.  ?Lymphadenopathy:  ?   Cervical: No cervical adenopathy.  ?Skin: ?   General: Skin is warm and dry.  ?   Findings: No rash.  ?Neurological:  ?   Mental Status: He is alert and oriented to person, place, and time.  ?   Cranial Nerves: No cranial nerve deficit.  ?Psychiatric:     ?   Mood and Affect: Mood normal.     ?   Behavior: Behavior normal.  ?This patient is seen for symptoms that pends 2 weeks this is a ? ?BP 132/78 (BP Location: Right Arm, Patient Position: Sitting, Cuff Size: Large)   Pulse 72   Temp 97.6 ?F (36.4 ?C) (Temporal)   Ht 6\' 5"  (1.956 m)   Wt 257 lb 6.4 oz (116.8 kg)   SpO2 97%   BMI 30.52 kg/m?  ?Wt Readings from Last 3 Encounters:  ?04/29/21 257 lb 6.4 oz (116.8 kg)  ?04/04/21 249 lb 6.4 oz (113.1 kg)  ?11/19/18 264 lb 8.8 oz (120 kg)  ? ? ? ?There are no preventive care reminders to display for this patient. ? ?There are no preventive care  reminders to display for this patient. ? ?Lab Results  ?Component Value Date  ? TSH 0.72 07/17/2017  ? ?Lab Results  ?Component Value Date  ? WBC 3.6 (L) 04/04/2021  ? HGB 14.9 04/04/2021  ? HCT 44.8 04/04/2021  ? MCV 86.5 04/04/2021  ? PLT 226.0 04/04/2021  ? ?Lab Results  ?Component Value Date  ?  NA 140 04/04/2021  ? K 4.2 04/04/2021  ? CO2 29 04/04/2021  ? GLUCOSE 82 04/04/2021  ? BUN 18 04/04/2021  ? CREATININE 1.18 04/04/2021  ? BILITOT 0.7 04/04/2021  ? ALKPHOS 59 04/04/2021  ? AST 19 04/04/2021  ? ALT 16 04/04/2021  ? PROT 7.3 04/04/2021  ? ALBUMIN 4.9 04/04/2021  ? CALCIUM 9.6 04/04/2021  ? ANIONGAP 8 05/30/2018  ? GFR 85.51 04/04/2021  ? ?Lab Results  ?Component Value Date  ? CHOL 178 04/04/2021  ? ?Lab Results  ?Component Value Date  ? HDL 47.10 04/04/2021  ? ?Lab Results  ?Component Value Date  ? LDLCALC 112 (H) 04/04/2021  ? ?Lab Results  ?Component Value Date  ? TRIG 92.0 04/04/2021  ? ?Lab Results  ?Component Value Date  ? CHOLHDL 4 04/04/2021  ? ?Lab Results  ?Component Value Date  ? HGBA1C 5.4 04/04/2021  ? ? ?  ?Assessment & Plan:  ? ?Problem List Items Addressed This Visit   ? ?  ? Other  ? Nonintractable episodic headache - Primary  ? Relevant Medications  ? predniSONE (DELTASONE) 20 MG tablet  ? Other Relevant Orders  ? Ambulatory referral to Neurology  ? Anxiety  ? ?Other Visit Diagnoses   ? ? Seasonal allergic rhinitis due to pollen      ? Relevant Medications  ? mometasone (NASONEX) 50 MCG/ACT nasal spray  ? predniSONE (DELTASONE) 20 MG tablet  ? ?  ? ? ?Meds ordered this encounter  ?Medications  ? mometasone (NASONEX) 50 MCG/ACT nasal spray  ?  Sig: Place 2 sprays into the nose daily.  ?  Dispense:  1 each  ?  Refill:  12  ? predniSONE (DELTASONE) 20 MG tablet  ?  Sig: Take 1 tablet (20 mg total) by mouth 2 (two) times daily with a meal for 7 days.  ?  Dispense:  14 tablet  ?  Refill:  0  ? ? ?Follow-up: Return in about 6 weeks (around 06/10/2021).  ?Patient will take prednisone 20 mg twice  daily for a week.  He will start Nasonex.  Advised that this medication is not efficacious unless he takes it regularly.  He will go ahead and start the venlafaxine and Elavil as directed. ? ?Mliss Sax, MD ?

## 2021-05-06 ENCOUNTER — Encounter: Payer: Self-pay | Admitting: Neurology

## 2021-09-18 NOTE — Progress Notes (Deleted)
NEUROLOGY CONSULTATION NOTE  Brett Taylor MRN: [id number] DOB: 1995-06-08  Referring provider: Nadene Rubins, MD Primary care provider: Nadene Rubins, MD  Reason for consult:  headache  Assessment/Plan:   ***   Subjective:  Brett Taylor is a 26 year old male with ADHD, anxiety and depression who presents for headache.  History supplemented by referring provider's note.  Onset:  *** Location:  *** Quality:  *** Intensity:  ***.  *** denies new headache, thunderclap headache or severe headache that wakes *** from sleep. Aura:  *** Prodrome:  *** Postdrome:  *** Associated symptoms:  ***.  *** denies associated unilateral numbness or weakness. Duration:  *** Frequency:  *** Frequency of abortive medication: *** Triggers:  *** Relieving factors:  *** Activity:  ***  Prior workup included CT head on 08/04/2016, which was personally reviewed and was normal.  Past NSAIDS/analgesics:  *** Past abortive triptans:  *** Past abortive ergotamine:  none Past muscle relaxants:  none Past anti-emetic:  none Past antihypertensive medications:  none Past antidepressant medications:  amitriptyline, venlafaxine Past anticonvulsant medications:  none Past anti-CGRP:  none Past vitamins/Herbal/Supplements:  *** Past antihistamines/decongestants:  Zyrtec, Benadryl Other past therapies:  ***  Current NSAIDS/analgesics:  *** Current triptans:  none Current ergotamine:  none Current anti-emetic:  none Current muscle relaxants:  none Current Antihypertensive medications:  none Current Antidepressant medications:  none Current Anticonvulsant medications:  none Current anti-CGRP:  none Current Vitamins/Herbal/Supplements:  none Current Antihistamines/Decongestants:  *** Other therapy:  *** Hormone/birth control:  none   Caffeine:  *** Alcohol:  *** Smoker:  *** Diet:  *** Exercise:  *** Depression:  ***; Anxiety:  *** Other pain:  *** Sleep hygiene:  *** Family  history of headache:  ***      PAST MEDICAL HISTORY: Past Medical History:  Diagnosis Date   ADHD (attention deficit hyperactivity disorder)    Anxiety    Depression    History of heart murmur in childhood    Wears glasses     PAST SURGICAL HISTORY: Past Surgical History:  Procedure Laterality Date   NO PAST SURGERIES     TONSILLECTOMY      MEDICATIONS: Current Outpatient Medications on File Prior to Visit  Medication Sig Dispense Refill   amitriptyline (ELAVIL) 25 MG tablet Take 1 tablet (25 mg total) by mouth at bedtime. (Patient not taking: Reported on 04/29/2021) 30 tablet 2   mometasone (NASONEX) 50 MCG/ACT nasal spray Place 2 sprays into the nose daily. 1 each 12   venlafaxine XR (EFFEXOR XR) 75 MG 24 hr capsule Take 1 capsule (75 mg total) by mouth daily with breakfast. (Patient not taking: Reported on 04/29/2021) 30 capsule 2   No current facility-administered medications on file prior to visit.    ALLERGIES: No Known Allergies  FAMILY HISTORY: Family History  Problem Relation Age of Onset   Hypertension Mother    Cancer Maternal Grandmother        breast   Heart disease Maternal Grandfather    Diabetes Other        maternal side   Stroke Neg Hx     Objective:  *** General: No acute distress.  Patient appears well-groomed.   Head:  Normocephalic/atraumatic Eyes:  fundi examined but not visualized Neck: supple, no paraspinal tenderness, full range of motion Back: No paraspinal tenderness Heart: regular rate and rhythm Lungs: Clear to auscultation bilaterally. Vascular: No carotid bruits. Neurological Exam: Mental status: alert and oriented to person, place, and  time, speech fluent and not dysarthric, language intact. Cranial nerves: CN I: not tested CN II: pupils equal, round and reactive to light, visual fields intact CN III, IV, VI:  full range of motion, no nystagmus, no ptosis CN V: facial sensation intact. CN VII: upper and lower face  symmetric CN VIII: hearing intact CN IX, X: gag intact, uvula midline CN XI: sternocleidomastoid and trapezius muscles intact CN XII: tongue midline Bulk & Tone: normal, no fasciculations. Motor:  muscle strength 5/5 throughout Sensation:  Pinprick, temperature and vibratory sensation intact. Deep Tendon Reflexes:  2+ throughout,  toes downgoing.   Finger to nose testing:  Without dysmetria.   Heel to shin:  Without dysmetria.   Gait:  Normal station and stride.  Romberg negative.    Thank you for allowing me to take part in the care of this patient.  Shon Millet, DO  CC: ***

## 2021-09-20 ENCOUNTER — Encounter: Payer: Self-pay | Admitting: Neurology

## 2021-09-20 ENCOUNTER — Ambulatory Visit: Payer: BC Managed Care – PPO | Admitting: Neurology

## 2021-09-20 DIAGNOSIS — Z029 Encounter for administrative examinations, unspecified: Secondary | ICD-10-CM

## 2021-11-05 ENCOUNTER — Encounter (HOSPITAL_COMMUNITY): Payer: Self-pay

## 2021-11-05 ENCOUNTER — Emergency Department (HOSPITAL_COMMUNITY): Payer: BC Managed Care – PPO

## 2021-11-05 ENCOUNTER — Emergency Department (HOSPITAL_COMMUNITY)
Admission: EM | Admit: 2021-11-05 | Discharge: 2021-11-05 | Disposition: A | Discharge status: Home / Self Care | Payer: BC Managed Care – PPO | Attending: Emergency Medicine | Admitting: Emergency Medicine

## 2021-11-05 DIAGNOSIS — R519 Headache, unspecified: Secondary | ICD-10-CM | POA: Diagnosis not present

## 2021-11-05 LAB — BASIC METABOLIC PANEL
Anion gap: 8 (ref 5–15)
BUN: 11 mg/dL (ref 6–20)
CO2: 25 mmol/L (ref 22–32)
Calcium: 9.3 mg/dL (ref 8.9–10.3)
Chloride: 106 mmol/L (ref 98–111)
Creatinine, Ser: 1.05 mg/dL (ref 0.61–1.24)
GFR, Estimated: >60 mL/min (ref >60)
Glucose, Bld: 101 mg/dL — ABNORMAL HIGH (ref 70–99)
Potassium: 3.8 mmol/L (ref 3.5–5.1)
Sodium: 139 mmol/L (ref 135–145)

## 2021-11-05 LAB — CBC
HCT: 48.8 % (ref 39.0–52.0)
Hemoglobin: 15.7 g/dL (ref 13.0–17.0)
MCH: 27.7 pg (ref 26.0–34.0)
MCHC: 32.2 g/dL (ref 30.0–36.0)
MCV: 86.2 fL (ref 80.0–100.0)
Platelets: 297 10*3/uL (ref 150–400)
RBC: 5.66 MIL/uL (ref 4.22–5.81)
RDW: 12.3 % (ref 11.5–15.5)
WBC: 4.2 10*3/uL (ref 4.0–10.5)
nRBC: 0 % (ref 0.0–0.2)

## 2021-11-05 NOTE — Discharge Instructions (Signed)
Return to the ED with any new or worsening signs or symptoms such as one-sided weakness or numbness, blurred vision Please follow-up with neurology.  Please call and make an appointment to be seen. Please read attached guide concerning migraines Continue taking Tylenol at home for headaches

## 2021-11-05 NOTE — ED Triage Notes (Signed)
Pt BIB EMS c/o recurrent headaches x approximately 8 months. Pt has been set up with neurology and missed his appt. Pt denies having scans of his head. Pt reports the pain moves around his head and describes the pain as stinging and numb. Pt reports increased anxiety surrounding the headaches due to family history of brain tumors.

## 2021-11-05 NOTE — ED Provider Notes (Signed)
[facility]-EMERGENCY DEPT Provider Note   CSN: [id number] Arrival date & time: 11/05/21  1732     History  Chief Complaint  Patient presents with   Headache    Brett Taylor is a 26 y.o. male with medical history of ADHD, anxiety, depression.  Patient presents to the ED for evaluation of headaches.  Patient reports that for the last 6 months he has had daily headaches that he describes as being on the left side of his head and stabbing pain.  Patient states he was supposed to follow-up with neurology however he missed his appointment, he is unsure who to call to reschedule the appointment.  When asked the patient who his neurologist was that he was sent to, he cannot provide this information.  The patient states his family is a history of brain cancer and he is worried about brain aneurysms.  The patient denies any significant dizziness however states in triage note that he did have dizziness however not recently.  The patient denies any vision changes, nausea, vomiting, one-sided weakness or numbness, chest pain or shortness of breath.   Headache Associated symptoms: no fever, no nausea, no numbness, no vomiting and no weakness        Home Medications Prior to Admission medications   Medication Sig Start Date End Date Taking? Authorizing Provider  amitriptyline (ELAVIL) 25 MG tablet Take 1 tablet (25 mg total) by mouth at bedtime. Patient not taking: Reported on 04/29/2021 04/04/21   Mliss Sax, MD  mometasone (NASONEX) 50 MCG/ACT nasal spray Place 2 sprays into the nose daily. 04/29/21   Mliss Sax, MD  venlafaxine XR (EFFEXOR XR) 75 MG 24 hr capsule Take 1 capsule (75 mg total) by mouth daily with breakfast. Patient not taking: Reported on 04/29/2021 04/04/21   Mliss Sax, MD      Allergies    Patient has no known allergies.    Review of Systems   Review of Systems  Constitutional:  Negative for fever.  Eyes:  Negative  for visual disturbance.  Respiratory:  Negative for shortness of breath.   Cardiovascular:  Negative for chest pain.  Gastrointestinal:  Negative for nausea and vomiting.  Neurological:  Positive for headaches. Negative for syncope, weakness and numbness.    Physical Exam Updated Vital Signs BP 133/88 (BP Location: Right Arm)   Pulse 74   Temp 98 F (36.7 C) (Oral)   Resp 14   SpO2 99%  Physical Exam Vitals and nursing note reviewed.  Constitutional:      General: He is not in acute distress.    Appearance: He is well-developed. He is not ill-appearing, toxic-appearing or diaphoretic.  HENT:     Head: Normocephalic and atraumatic.     Nose: Nose normal. No congestion.     Mouth/Throat:     Mouth: Mucous membranes are moist.     Pharynx: Oropharynx is clear.  Eyes:     Extraocular Movements: Extraocular movements intact.     Conjunctiva/sclera: Conjunctivae normal.     Pupils: Pupils are equal, round, and reactive to light.  Cardiovascular:     Rate and Rhythm: Normal rate and regular rhythm.  Pulmonary:     Effort: Pulmonary effort is normal.     Breath sounds: Normal breath sounds. No wheezing.  Abdominal:     General: Abdomen is flat. Bowel sounds are normal.     Palpations: Abdomen is soft.     Tenderness: There is no abdominal  tenderness.  Musculoskeletal:     Cervical back: Normal range of motion and neck supple. No tenderness.  Skin:    General: Skin is warm and dry.     Capillary Refill: Capillary refill takes less than 2 seconds.  Neurological:     General: No focal deficit present.     Mental Status: He is alert and oriented to person, place, and time.     GCS: GCS eye subscore is 4. GCS verbal subscore is 5. GCS motor subscore is 6.     Cranial Nerves: Cranial nerves 2-12 are intact. No cranial nerve deficit.     Sensory: Sensation is intact. No sensory deficit.     Motor: Motor function is intact. No weakness.     Coordination: Coordination is intact.  Heel to [facility] Test normal.     Comments: Cranial nerves II through XII grossly intact.  Intact finger-nose, intact heel-to-shin.  Romberg negative, gait normal.  Alert and oriented x3.  Moves all 4 limbs spontaneously, normal coordination.  No pronator drift.  Intact strength 5 out of 5 bilateral upper and lower extremities.     ED Results / Procedures / Treatments   Labs (all labs ordered are listed, but only abnormal results are displayed) Labs Reviewed  BASIC METABOLIC PANEL - Abnormal; Notable for the following components:      Result Value   Glucose, Bld 101 (*)    All other components within normal limits  CBC    EKG EKG Interpretation  Date/Time:  Tuesday November 05 2021 18:14:09 EDT Ventricular Rate:  84 PR Interval:  157 QRS Duration: 85 QT Interval:  339 QTC Calculation: 401 R Axis:   90 Text Interpretation: Sinus rhythm inferior ST depression more pronounced than prior 2022/06/09 Confirmed by Meridee Score [phone]) on 11/05/2021 8:52:16 PM  Radiology CT Head Wo Contrast  Result Date: 11/05/2021 CLINICAL DATA:  Headache, chronic, new features or increased frequency EXAM: CT HEAD WITHOUT CONTRAST TECHNIQUE: Contiguous axial images were obtained from the base of the skull through the vertex without intravenous contrast. RADIATION DOSE REDUCTION: This exam was performed according to the departmental dose-optimization program which includes automated exposure control, adjustment of the mA and/or kV according to patient size and/or use of iterative reconstruction technique. COMPARISON:  CT head 08/04/2016 FINDINGS: Brain: No evidence of large-territorial acute infarction. No parenchymal hemorrhage. No mass lesion. No extra-axial collection. No mass effect or midline shift. No hydrocephalus. Septum pellucidum variant. Basilar cisterns are patent. Vascular: No hyperdense vessel. Skull: No acute fracture or focal lesion. Sinuses/Orbits: Paranasal sinuses and mastoid air cells are clear.  The orbits are unremarkable. Other: None. IMPRESSION: No acute intracranial abnormality. Electronically Signed   By: Tish Frederickson M.D.   On: 11/05/2021 20:36    Procedures Procedures   Medications Ordered in ED Medications - No data to display  ED Course/ Medical Decision Making/ A&P                           Medical Decision Making  26 year old male presents to the ED for evaluation.  Please see HPI for further details.  On my examination the patient is afebrile, nontachycardic.  Patient lung sounds clear bilaterally, he is not hypoxic.  Patient abdomen is soft and compressible throughout.  Patient neurological examination shows no focal neurodeficits.  Patient has intact finger-nose, intact heel-to-shin.  Patient is Romberg negative, gait normal.  Patient has no pronator drift.  Patient alert and oriented  x3.  Patient was all 4 limbs spontaneously with coordination.  Patient CT head ordered in triage shows no intracranial abnormality.  The patient lab work is unremarkable.  At this time, the patient will be discharged home and advised to follow-up with neurology.  The patient was referred to neurology.  Patient all of his questions answered to his satisfaction.  The patient stable at this time for discharge home.  Final Clinical Impression(s) / ED Diagnoses Final diagnoses:  Headache disorder    Rx / DC Orders ED Discharge Orders     None         Lawana Chambers 11/05/21 2128    Hayden Rasmussen, MD 11/06/21 1017

## 2021-11-05 NOTE — ED Provider Triage Note (Signed)
Emergency Medicine Provider Triage Evaluation Note  Brett Taylor , a 26 y.o. male  was evaluated in triage.  Pt complains of headaches intermittently for greater than 6 months that he reports as intermittent but somewhat severe, like something is moving around inside his head.  Patient reports family history of brain cancer, and worried about a brain aneurysm.  Patient reports he is having increasing episodes of dizziness.  At time of evaluation he denies significant dizziness but reports mild headache.  He denies any vision changes, nausea, vomiting.  Review of Systems  Positive: Headache, dizziness Negative: Nausea, vomiting, weakness, numbness  Physical Exam  BP (!) 153/107   Pulse 84   Temp 98.6 F (37 C) (Oral)   Resp 18   SpO2 95%  Gen:   Awake, no distress   Resp:  Normal effort  MSK:   Moves extremities without difficulty  Other:  Cranial nerves II through XII grossly intact.  Intact finger-nose, intact heel-to-shin.  Romberg negative, gait normal.  Alert and oriented x3.  Moves all 4 limbs spontaneously, normal coordination.  No pronator drift.  Intact strength 5 out of 5 bilateral upper and lower extremities.   Medical Decision Making  Medically screening exam initiated at 6:15 PM.  Appropriate orders placed.  Brett Taylor was informed that the remainder of the evaluation will be completed by another provider, this initial triage assessment does not replace that evaluation, and the importance of remaining in the ED until their evaluation is complete.  Workup initiated   Anselmo Pickler, [place] 11/05/21 8185

## 2022-04-30 ENCOUNTER — Encounter: Payer: BC Managed Care – PPO | Admitting: Family Medicine

## 2022-10-07 ENCOUNTER — Emergency Department (HOSPITAL_BASED_OUTPATIENT_CLINIC_OR_DEPARTMENT_OTHER): Payer: Medicaid Other

## 2022-10-07 ENCOUNTER — Emergency Department (HOSPITAL_BASED_OUTPATIENT_CLINIC_OR_DEPARTMENT_OTHER)
Admission: EM | Admit: 2022-10-07 | Discharge: 2022-10-07 | Disposition: A | Discharge status: Home / Self Care | Payer: Medicaid Other

## 2022-10-07 ENCOUNTER — Encounter (HOSPITAL_BASED_OUTPATIENT_CLINIC_OR_DEPARTMENT_OTHER): Payer: Self-pay

## 2022-10-07 ENCOUNTER — Other Ambulatory Visit: Payer: Self-pay

## 2022-10-07 DIAGNOSIS — R112 Nausea with vomiting, unspecified: Secondary | ICD-10-CM | POA: Insufficient documentation

## 2022-10-07 DIAGNOSIS — R519 Headache, unspecified: Secondary | ICD-10-CM | POA: Diagnosis present

## 2022-10-07 DIAGNOSIS — F419 Anxiety disorder, unspecified: Secondary | ICD-10-CM | POA: Insufficient documentation

## 2022-10-07 MED ORDER — DIPHENHYDRAMINE HCL 50 MG/ML IJ SOLN
12.5000 mg | Freq: Once | INTRAMUSCULAR | Status: AC
  Administered 2022-10-07: 12.5 mg via INTRAVENOUS
  Filled 2022-10-07: qty 1

## 2022-10-07 MED ORDER — PROCHLORPERAZINE EDISYLATE 10 MG/2ML IJ SOLN
10.0000 mg | Freq: Once | INTRAMUSCULAR | Status: AC
  Administered 2022-10-07: 10 mg via INTRAVENOUS
  Filled 2022-10-07: qty 2

## 2022-10-07 MED ORDER — KETOROLAC TROMETHAMINE 15 MG/ML IJ SOLN
15.0000 mg | Freq: Once | INTRAMUSCULAR | Status: AC
  Administered 2022-10-07: 15 mg via INTRAVENOUS
  Filled 2022-10-07: qty 1

## 2022-10-07 MED ORDER — ACETAMINOPHEN 325 MG PO TABS
650.0000 mg | ORAL_TABLET | Freq: Once | ORAL | Status: AC
  Administered 2022-10-07: 650 mg via ORAL
  Filled 2022-10-07: qty 2

## 2022-10-07 MED ORDER — MAGNESIUM SULFATE 2 GM/50ML IV SOLN
2.0000 g | Freq: Once | INTRAVENOUS | Status: AC
  Administered 2022-10-07: 2 g via INTRAVENOUS
  Filled 2022-10-07: qty 50

## 2022-10-07 MED ORDER — LACTATED RINGERS IV BOLUS
1000.0000 mL | Freq: Once | INTRAVENOUS | Status: AC
  Administered 2022-10-07: 1000 mL via INTRAVENOUS

## 2022-10-07 NOTE — ED Provider Notes (Signed)
[address] EMERGENCY DEPARTMENT AT [facility] Provider Note   CSN: [id number] Arrival date & time: 10/07/22  0630     History  Chief Complaint  Patient presents with   Anxiety   Headache    Brett Taylor is a 27 y.o. male.  27 year old male presenting emergency department chief complaint of headache.  Reports it feels like someone is kicking him in his forehead.  No vision changes.  Did have some nausea and vomiting earlier today.  Headache woke him from sleep this morning.  Reports history of headaches roughly every other day, but this headache is worse in intensity.  Reported some blurred vision earlier, but no vision loss, no unilateral weakness, aphasia, facial droop.  No fevers or chills.   Anxiety Associated symptoms include headaches.  Headache      Home Medications Prior to Admission medications   Not on File      Allergies    Patient has no known allergies.    Review of Systems   Review of Systems  Neurological:  Positive for headaches.    Physical Exam Updated Vital Signs BP 119/73   Pulse 88   Temp 98.2 F (36.8 C) (Oral)   Resp 16   SpO2 97%  Physical Exam Vitals and nursing note reviewed.  HENT:     Head: Normocephalic and atraumatic.  Eyes:     General: No visual field deficit. Cardiovascular:     Rate and Rhythm: Normal rate and regular rhythm.  Pulmonary:     Effort: Pulmonary effort is normal.  Abdominal:     Palpations: Abdomen is soft.  Musculoskeletal:        General: Normal range of motion.     Cervical back: Normal range of motion.  Skin:    General: Skin is warm.  Neurological:     Mental Status: He is alert and oriented to person, place, and time.     Cranial Nerves: No cranial nerve deficit, dysarthria or facial asymmetry.     Sensory: No sensory deficit.     Motor: No weakness.     Coordination: Coordination normal.  Psychiatric:        Mood and Affect: Mood normal.        Behavior: Behavior normal.      ED Results / Procedures / Treatments   Labs (all labs ordered are listed, but only abnormal results are displayed) Labs Reviewed - No data to display  EKG None  Radiology CT Head Wo Contrast  Result Date: 10/07/2022 CLINICAL DATA:  Headache with increasing frequency.  Anxiety attack EXAM: CT HEAD WITHOUT CONTRAST TECHNIQUE: Contiguous axial images were obtained from the base of the skull through the vertex without intravenous contrast. RADIATION DOSE REDUCTION: This exam was performed according to the departmental dose-optimization program which includes automated exposure control, adjustment of the mA and/or kV according to patient size and/or use of iterative reconstruction technique. COMPARISON:  11/05/2021 FINDINGS: Brain: No evidence of acute infarction, hemorrhage, hydrocephalus, extra-axial collection or mass lesion/mass effect. Cavum septum pellucidum, incidental. Vascular: No hyperdense vessel or unexpected calcification. Skull: Normal. Negative for fracture or focal lesion. Sinuses/Orbits: No acute finding. IMPRESSION: Stable head CT.  No explanation for headache. Electronically Signed   By: Tiburcio Pea M.D.   On: 10/07/2022 07:29    Procedures Procedures    Medications Ordered in ED Medications  ketorolac (TORADOL) 15 MG/ML injection 15 mg (15 mg Intravenous Given 10/07/22 0848)  acetaminophen (TYLENOL) tablet 650 mg (650 mg  Oral Given 10/07/22 0747)  prochlorperazine (COMPAZINE) injection 10 mg (10 mg Intravenous Given 10/07/22 0848)  diphenhydrAMINE (BENADRYL) injection 12.5 mg (12.5 mg Intravenous Given 10/07/22 0848)  lactated ringers bolus 1,000 mL (0 mLs Intravenous Stopped 10/07/22 1110)  magnesium sulfate IVPB 2 g 50 mL (0 g Intravenous Stopped 10/07/22 1110)  ketorolac (TORADOL) 15 MG/ML injection 15 mg (15 mg Intravenous Given 10/07/22 0940)    ED Course/ Medical Decision Making/ A&P Clinical Course as of 10/07/22 1504  Tue Oct 07, 2022  0737 CT Head Wo  Contrast IMPRESSION: Stable head CT.  No explanation for headache.   [TY]  0934 Reevaluated, patient notes little improvement in his headache.  Will give IV fluids, redose Toradol and give mag. [TY]  1110 Reevaluated patient, reports improvement of his headache to a tolerable level.  With negative CT scan and normal neuroexam feel that he is stable for discharge.  Return precautions given. [TY]    Clinical Course User Index [TY] Coral Spikes, DO                                 Medical Decision Making Well-appearing 27 year old male presenting emergency department for headache.  Reportedly history of the same.  Vital signs reviewed, reassuring.  Considered complex migraine versus CVA although less likely given no localizing deficits on exam versus meningitis although less likely given no fever tachycardia signs of meningeal irritation on exam.  Patient is mother in room and provided additional history that patient does have headaches frequently and also has been dealing with some anxiety issues as well.  Shared decision making regarding CT scan to further evaluate his headache given its increasing frequency and severity versus symptomatic treatment.  Patient opted for CT scan.  Will treat with migraine cocktail as well.  Consider labs, however low suspicion for acute infectious or metabolic derangements. See ED course for reevaluation and final MDM.  Amount and/or Complexity of Data Reviewed Radiology: ordered. Decision-making details documented in ED Course.  Risk OTC drugs. Prescription drug management.          Final Clinical Impression(s) / ED Diagnoses Final diagnoses:  Acute nonintractable headache, unspecified headache type    Rx / DC Orders ED Discharge Orders          Ordered    Ambulatory referral to Neurology       Comments: An appointment is requested in approximately: 1 weeks   10/07/22 1111              Coral Spikes, DO 10/07/22 1504

## 2022-10-07 NOTE — ED Triage Notes (Addendum)
Pt to ED by EMS from home c/o anxiety and a headache. Pt has a hx of the same. Pt states he woke up with a headache which led to an anxiety attack. Pt arrives A+O, VSS, NADN. Pt states this has been an ongoing issue since 2022. Pt states this has become and increasing problem since he got out of prison. Pt also endorses increasing anxiety based around leaving the house.

## 2022-10-07 NOTE — Discharge Instructions (Signed)
Please return daily if developing worsening headache, vision loss, facial droop, seizure, unilateral weakness, chest pain, shortness of breath or any new or worsening symptoms that are concerning to you.

## 2022-11-28 ENCOUNTER — Other Ambulatory Visit: Payer: Self-pay

## 2022-11-28 ENCOUNTER — Emergency Department (HOSPITAL_COMMUNITY)
Admission: EM | Admit: 2022-11-28 | Discharge: 2022-11-29 | Disposition: A | Discharge status: Home / Self Care | Payer: Medicaid Other | Attending: Student | Admitting: Student

## 2022-11-28 ENCOUNTER — Encounter (HOSPITAL_COMMUNITY): Payer: Self-pay | Admitting: Emergency Medicine

## 2022-11-28 DIAGNOSIS — F419 Anxiety disorder, unspecified: Secondary | ICD-10-CM | POA: Insufficient documentation

## 2022-11-28 DIAGNOSIS — Z87891 Personal history of nicotine dependence: Secondary | ICD-10-CM | POA: Diagnosis not present

## 2022-11-28 NOTE — ED Triage Notes (Addendum)
Pt BIB EMS for complaints of anxiety, felt like his heart was racing while watching TV. Reports it feels like his heart was fluttering, reports he had SHOB and some chest pain. Hx of anxiety.

## 2022-11-29 MED ORDER — HYDROXYZINE HCL 25 MG PO TABS: 25.0000 mg | ORAL_TABLET | Freq: Four times a day (QID) | ORAL | 0 refills | Status: AC | PRN

## 2022-11-29 MED ORDER — HYDROXYZINE HCL 25 MG PO TABS
25.0000 mg | ORAL_TABLET | Freq: Once | ORAL | Status: AC
  Administered 2022-11-29: 25 mg via ORAL
  Filled 2022-11-29: qty 1

## 2022-11-29 NOTE — ED Provider Notes (Signed)
[place] EMERGENCY DEPARTMENT AT [facility] Provider Note  CSN: [id number] Arrival date & time: 11/28/22 1936  Chief Complaint(s) Anxiety  HPI Brett Taylor is a 27 y.o. male with PMH ADHD, anxiety, depression who presents emergency room for evaluation of multiple complaints including heart palpitations, anxiety, headache chest tightness.  He is accompanied by his mother who states that the patient has had multiple episodes like this with his heart racing and severe anxiety.  Patient unable to get in the car and drive to complete errands because of his anxiety.  Mother states that she has to be outside with him while he mows the grass and the patient states that this is because he needs somebody "watching his back".  Believes symptoms worsened after he was incarcerated.  Currently takes no medications for his anxiety.  States that he had an episode today while watching TV with his heart racing and they came to the emergency department for further evaluation.  Here in the ER, he states his symptoms have mostly improved but he still is feeling a little anxious.  Denies associated nausea, vomiting, headache, fever or other systemic symptoms.  No illicit substance use.   Past Medical History Past Medical History:  Diagnosis Date   ADHD (attention deficit hyperactivity disorder)    Anxiety    Anxiety    Depression    History of heart murmur in childhood    Wears glasses    Patient Active Problem List   Diagnosis Date Noted   Nonintractable episodic headache 04/04/2021   Anxiety 04/04/2021   Exposure to STD 03/22/2018   Chest pain 07/17/2017   Healthcare maintenance 07/03/2017   Healthy adult on routine physical examination 05/14/2015   Stuttering 05/14/2015   Elevated blood sugar 05/14/2015   Overweight 05/14/2015   Need for HPV vaccination 05/14/2015   Home Medication(s) Prior to Admission medications   Medication Sig Start Date End Date Taking? Authorizing Provider   hydrOXYzine (ATARAX) 25 MG tablet Take 1 tablet (25 mg total) by mouth every 6 (six) hours as needed for anxiety. 11/29/22  Yes Lyrical Sowle, MD                                                                                                                                    Past Surgical History Past Surgical History:  Procedure Laterality Date   NO PAST SURGERIES     TONSILLECTOMY     Family History Family History  Problem Relation Age of Onset   Hypertension Mother    Cancer Maternal Grandmother        breast   Heart disease Maternal Grandfather    Diabetes Other        maternal side   Stroke Neg Hx     Social History Social History   Tobacco Use   Smoking status: Former    Current packs/day: 0.25    Average packs/day: 0.3  packs/day for 4.0 years (1.0 ttl pk-yrs)    Types: Cigarettes   Smokeless tobacco: Never   Tobacco comments:    03/25/2016 "smoked a black right before I came in"  Vaping Use   Vaping status: Never Used  Substance Use Topics   Alcohol use: Not Currently   Drug use: Not Currently    Types: Marijuana   Allergies Patient has no known allergies.  Review of Systems Review of Systems  Respiratory:  Positive for chest tightness.   Cardiovascular:  Positive for palpitations.  Neurological:  Positive for headaches.  Psychiatric/Behavioral:  The patient is nervous/anxious.     Physical Exam Vital Signs  I have reviewed the triage vital signs BP 128/81   Pulse 83   Temp 98.3 F (36.8 C) (Oral)   Resp 16   SpO2 100%   Physical Exam Vitals and nursing note reviewed.  Constitutional:      General: He is not in acute distress.    Appearance: He is well-developed.  HENT:     Head: Normocephalic and atraumatic.  Eyes:     Conjunctiva/sclera: Conjunctivae normal.  Cardiovascular:     Rate and Rhythm: Normal rate and regular rhythm.     Heart sounds: No murmur heard. Pulmonary:     Effort: Pulmonary effort is normal. No respiratory  distress.     Breath sounds: Normal breath sounds.  Abdominal:     Palpations: Abdomen is soft.     Tenderness: There is no abdominal tenderness.  Musculoskeletal:        General: No swelling.     Cervical back: Neck supple.  Skin:    General: Skin is warm and dry.     Capillary Refill: Capillary refill takes less than 2 seconds.  Neurological:     Mental Status: He is alert.  Psychiatric:        Mood and Affect: Mood normal.     ED Results and Treatments Labs (all labs ordered are listed, but only abnormal results are displayed) Labs Reviewed - No data to display                                                                                                                        Radiology No results found.  Pertinent labs & imaging results that were available during my care of the patient were reviewed by me and considered in my medical decision making (see MDM for details).  Medications Ordered in ED Medications  hydrOXYzine (ATARAX) tablet 25 mg (25 mg Oral Given 11/29/22 0042)  Procedures Procedures  (including critical care time)  Medical Decision Making / ED Course   This patient presents to the ED for concern of palpitations, chest tightness, anxiety, this involves an extensive number of treatment options, and is a complaint that carries with it a high risk of complications and morbidity.  The differential diagnosis includes anxiety, PTSD, agoraphobia, panic disorder, illicit substance use, ACS, pneumonia  MDM: Patient seen emergency room for evaluation multiple complaints described above.  Physical exam is largely unremarkable with a normal cardiopulmonary exam, normal abdominal exam, neurologic exam with no focal motor or sensory deficits, no cranial nerve deficits.  Patient is anxious appearing on exam and based on his clinical  history we decided to do a small Atarax trial prior to laboratory evaluation monitor for improvement.  On reevaluation after Atarax, patient states that he feels significantly better and all of his symptoms have significantly improved.  His presentation is likely consistent with baseline anxiety and panic disorder and he will be provided with an Atarax prescription for as needed use.  He was instructed to follow-up with the [facility] for further psychiatric evaluation.  I have very low suspicion for ACS at this time or underlying cardiac pathology and further laboratory evaluation, ECG testing and x-ray imaging deferred in the setting of complete resolution of symptoms after Atarax administration.  Patient given return precautions of which he and his mother voiced understanding.  Patient discharged   Additional history obtained: -Additional history obtained from mother -External records from outside source obtained and reviewed including: Chart review including previous notes, labs, imaging, consultation notes   Medicines ordered and prescription drug management: Meds ordered this encounter  Medications   hydrOXYzine (ATARAX) tablet 25 mg   hydrOXYzine (ATARAX) 25 MG tablet    Sig: Take 1 tablet (25 mg total) by mouth every 6 (six) hours as needed for anxiety.    Dispense:  30 tablet    Refill:  0    -I have reviewed the patients home medicines and have made adjustments as needed  Critical interventions none   Cardiac Monitoring: The patient was maintained on a cardiac monitor.  I personally viewed and interpreted the cardiac monitored which showed an underlying rhythm of: NSR  Social Determinants of Health:  Factors impacting patients care include: History of incarceration, multiple stressors at home   Reevaluation: After the interventions noted above, I reevaluated the patient and found that they have :improved  Co morbidities that complicate the patient evaluation  Past Medical  History:  Diagnosis Date   ADHD (attention deficit hyperactivity disorder)    Anxiety    Anxiety    Depression    History of heart murmur in childhood    Wears glasses       Dispostion: I considered admission for this patient, but at this time he does not meet inpatient criteria for admission and will be discharged with outpatient [facility] follow-up.  Return precautions given     Final Clinical Impression(s) / ED Diagnoses Final diagnoses:  Anxiety     @PCDICTATION @    Glendora Score, MD 11/29/22 [phone]

## 2022-12-24 ENCOUNTER — Encounter (HOSPITAL_COMMUNITY): Payer: Self-pay | Admitting: Emergency Medicine

## 2022-12-24 ENCOUNTER — Other Ambulatory Visit: Payer: Self-pay

## 2022-12-24 ENCOUNTER — Emergency Department (HOSPITAL_COMMUNITY)
Admission: EM | Admit: 2022-12-24 | Discharge: 2022-12-25 | Disposition: A | Discharge status: Home / Self Care | Payer: Medicaid Other | Attending: Emergency Medicine | Admitting: Emergency Medicine

## 2022-12-24 DIAGNOSIS — R569 Unspecified convulsions: Secondary | ICD-10-CM | POA: Insufficient documentation

## 2022-12-24 DIAGNOSIS — R519 Headache, unspecified: Secondary | ICD-10-CM | POA: Diagnosis not present

## 2022-12-24 LAB — CBC WITH DIFFERENTIAL/PLATELET
Abs Immature Granulocytes: 0 10*3/uL (ref 0.00–0.07)
Basophils Absolute: 0 10*3/uL (ref 0.0–0.1)
Basophils Relative: 1 %
Eosinophils Absolute: 0.1 10*3/uL (ref 0.0–0.5)
Eosinophils Relative: 2 %
HCT: 48.2 % (ref 39.0–52.0)
Hemoglobin: 15.7 g/dL (ref 13.0–17.0)
Immature Granulocytes: 0 %
Lymphocytes Relative: 39 %
Lymphs Abs: 2.1 10*3/uL (ref 0.7–4.0)
MCH: 27.8 pg (ref 26.0–34.0)
MCHC: 32.6 g/dL (ref 30.0–36.0)
MCV: 85.3 fL (ref 80.0–100.0)
Monocytes Absolute: 0.4 10*3/uL (ref 0.1–1.0)
Monocytes Relative: 7 %
Neutro Abs: 2.7 10*3/uL (ref 1.7–7.7)
Neutrophils Relative %: 51 %
Platelets: 284 10*3/uL (ref 150–400)
RBC: 5.65 MIL/uL (ref 4.22–5.81)
RDW: 12.6 % (ref 11.5–15.5)
WBC: 5.3 10*3/uL (ref 4.0–10.5)
nRBC: 0 % (ref 0.0–0.2)

## 2022-12-24 LAB — BASIC METABOLIC PANEL
Anion gap: 11 (ref 5–15)
BUN: 9 mg/dL (ref 6–20)
CO2: 24 mmol/L (ref 22–32)
Calcium: 10.2 mg/dL (ref 8.9–10.3)
Chloride: 108 mmol/L (ref 98–111)
Creatinine, Ser: 1.25 mg/dL — ABNORMAL HIGH (ref 0.61–1.24)
GFR, Estimated: >60 mL/min (ref >60)
Glucose, Bld: 97 mg/dL (ref 70–99)
Potassium: 3.9 mmol/L (ref 3.5–5.1)
Sodium: 143 mmol/L (ref 135–145)
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
## 2022-12-24 NOTE — ED Notes (Signed)
Pt snuck into the back hall way and approached the nurses station; asking this RN how much longer because he is tired of waiting. Pt educated on the process of being seen and asked to return to the waiting room.

## 2022-12-25 ENCOUNTER — Emergency Department (HOSPITAL_COMMUNITY): Payer: Medicaid Other

## 2022-12-25 NOTE — Discharge Instructions (Signed)
You were seen today for headache and seizure-like activity.  Your workup today is reassuring.  You could have had a complex migraine.  There is a chance this was also a seizure.  No medications are indicated at this time.  However you need follow-up with neurology.  You should not drive until cleared by neurology.
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
## 2023-01-13 ENCOUNTER — Encounter (HOSPITAL_COMMUNITY): Payer: Self-pay

## 2023-01-13 ENCOUNTER — Other Ambulatory Visit: Payer: Self-pay

## 2023-01-13 ENCOUNTER — Emergency Department (HOSPITAL_COMMUNITY)
Admission: EM | Admit: 2023-01-13 | Discharge: 2023-01-14 | Disposition: A | Discharge status: Home / Self Care | Payer: BLUE CROSS/BLUE SHIELD | Attending: Emergency Medicine | Admitting: Emergency Medicine

## 2023-01-13 DIAGNOSIS — R55 Syncope and collapse: Secondary | ICD-10-CM | POA: Diagnosis not present

## 2023-01-13 DIAGNOSIS — R519 Headache, unspecified: Secondary | ICD-10-CM | POA: Diagnosis present

## 2023-01-13 LAB — BASIC METABOLIC PANEL
Anion gap: 4 — ABNORMAL LOW (ref 5–15)
BUN: 13 mg/dL (ref 6–20)
CO2: 24 mmol/L (ref 22–32)
Calcium: 8.8 mg/dL — ABNORMAL LOW (ref 8.9–10.3)
Chloride: 109 mmol/L (ref 98–111)
Creatinine, Ser: 1.11 mg/dL (ref 0.61–1.24)
GFR, Estimated: >60 mL/min (ref >60)
Glucose, Bld: 103 mg/dL — ABNORMAL HIGH (ref 70–99)
Potassium: 3.4 mmol/L — ABNORMAL LOW (ref 3.5–5.1)
Sodium: 137 mmol/L (ref 135–145)

## 2023-01-13 LAB — CBC
HCT: 42.9 % (ref 39.0–52.0)
Hemoglobin: 14.4 g/dL (ref 13.0–17.0)
MCH: 28.4 pg (ref 26.0–34.0)
MCHC: 33.6 g/dL (ref 30.0–36.0)
MCV: 84.6 fL (ref 80.0–100.0)
Platelets: 274 10*3/uL (ref 150–400)
RBC: 5.07 MIL/uL (ref 4.22–5.81)
RDW: 12.5 % (ref 11.5–15.5)
WBC: 5.6 10*3/uL (ref 4.0–10.5)
nRBC: 0 % (ref 0.0–0.2)

## 2023-01-13 LAB — URINALYSIS, ROUTINE W REFLEX MICROSCOPIC
Bilirubin Urine: NEGATIVE
Glucose, UA: NEGATIVE mg/dL
Hgb urine dipstick: NEGATIVE
Ketones, ur: NEGATIVE mg/dL
Leukocytes,Ua: NEGATIVE
Nitrite: NEGATIVE
Protein, ur: NEGATIVE mg/dL
Specific Gravity, Urine: 1.03 (ref 1.005–1.030)
pH: 5 (ref 5.0–8.0)

## 2023-01-13 NOTE — ED Triage Notes (Signed)
 Pt to ED by EMS from home following several syncopal episodes today. Pt has a history of these episodes and has been seen here for this. Pt has an appointment with neurology in February. Endorses ongoing headaches, states he does have a headache today 5/10. Arrives A+O, VSS, NADN.

## 2023-01-13 NOTE — ED Provider Triage Note (Signed)
 Emergency Medicine Provider Triage Evaluation Note  Brett Taylor , a 27 y.o. male  was evaluated in triage.  Pt complains of headache and several syncopal episodes today.  He has a history of these episodes and has been evaluated in the ED previously.  He has appointment with neurology in February.    Review of Systems  Positive: As above Negative: As above  Physical Exam  BP (!) 143/102 (BP Location: Left Arm)   Pulse (!) 107   Temp 98.3 F (36.8 C) (Oral)   Resp 18   SpO2 95%  Gen:   Awake, no distress   Resp:  Normal effort  MSK:   Moves extremities without difficulty  Other:  He is alert and oriented  Medical Decision Making  Medically screening exam initiated at 8:17 PM.  Appropriate orders placed.  Brett Taylor was informed that the remainder of the evaluation will be completed by another provider, this initial triage assessment does not replace that evaluation, and the importance of remaining in the ED until their evaluation is complete.     Brett Taylor, [place] 01/13/23 2021

## 2023-01-14 NOTE — ED Provider Notes (Signed)
 [place] EMERGENCY DEPARTMENT AT [facility] Provider Note   CSN: [id number] Arrival date & time: 01/13/23  2010     History  Chief Complaint  Patient presents with   Loss of Consciousness    Brett Taylor is a 28 y.o. male.  Patient presents to the emergency department today for evaluation of headache and syncope.  Patient has a history of recurrent headaches.  He states that he is scheduled to see a neurologist next month.  He has had several ED visits for the same.  Sometimes these episodes are associated with syncope.  Yesterday evening patient developed a headache that was described as a sharp pain.  He had an episode of passing out with this.  He denies associated chest pain or shortness of breath.  EMS was called for transport.  No indication that patient hit his head.  He has not had any fevers or infectious symptoms.  He is eating and drinking well and has had no recent nausea, vomiting, or diarrhea.  Patient has had extended ED wait time of about 11 hours.  His headache has nearly resolved at this point. Patient denies signs of stroke including: facial droop, slurred speech, aphasia, weakness/numbness in extremities, imbalance/trouble walking.  Patient denies history of arrhythmia or family history of sudden cardiac death or arrhythmia.  Denies family history of cerebral aneurysm.  He does report older relatives with brain cancer.  Patient has had 3 CTs in the past 14 months during ED visits.  These have all been negative.  With these episodes of syncope he has never bit his tongue or been incontinent.       Home Medications Prior to Admission medications   Medication Sig Start Date End Date Taking? Authorizing Provider  hydrOXYzine  (ATARAX ) 25 MG tablet Take 1 tablet (25 mg total) by mouth every 6 (six) hours as needed for anxiety. 11/29/22   Kommor, Lum, MD      Allergies    Patient has no known allergies.    Review of Systems   Review of  Systems  Physical Exam Updated Vital Signs BP 137/77 (BP Location: Right Arm)   Pulse 89   Temp 98.1 F (36.7 C) (Oral)   Resp 18   SpO2 95%  Physical Exam Vitals and nursing note reviewed.  Constitutional:      Appearance: He is well-developed. He is not diaphoretic.  HENT:     Head: Normocephalic and atraumatic.     Comments: Patient does show me a patch on the left crown of the scalp which he noticed lost itshair, but now is growing back.  No abnormalities noted at this point other than there being shorter.    Right Ear: Tympanic membrane, ear canal and external ear normal.     Left Ear: Tympanic membrane, ear canal and external ear normal.     Nose: Nose normal.     Mouth/Throat:     Mouth: Mucous membranes are moist. Mucous membranes are not dry.     Pharynx: Uvula midline.  Eyes:     General: Lids are normal.     Conjunctiva/sclera: Conjunctivae normal.     Pupils: Pupils are equal, round, and reactive to light.  Neck:     Vascular: Normal carotid pulses. No carotid bruit or JVD.     Trachea: Trachea normal. No tracheal deviation.  Cardiovascular:     Rate and Rhythm: Normal rate and regular rhythm.     Pulses: No decreased pulses.  Radial pulses are 2+ on the right side and 2+ on the left side.     Heart sounds: Normal heart sounds, S1 normal and S2 normal. Heart sounds not distant. No murmur heard.    Comments: Regular rate, no murmur Pulmonary:     Effort: Pulmonary effort is normal. No respiratory distress.     Breath sounds: Normal breath sounds. No wheezing.  Chest:     Chest wall: No tenderness.  Abdominal:     General: Bowel sounds are normal.     Palpations: Abdomen is soft.     Tenderness: There is no abdominal tenderness. There is no guarding or rebound.  Musculoskeletal:        General: Normal range of motion.     Cervical back: Normal range of motion and neck supple. No tenderness or bony tenderness. No muscular tenderness.     Right lower  leg: No edema.     Left lower leg: No edema.  Skin:    General: Skin is warm and dry.     Coloration: Skin is not pale.  Neurological:     Mental Status: He is alert and oriented to person, place, and time. Mental status is at baseline.     GCS: GCS eye subscore is 4. GCS verbal subscore is 5. GCS motor subscore is 6.     Cranial Nerves: No cranial nerve deficit.     Sensory: No sensory deficit.     Motor: No abnormal muscle tone.     Coordination: Coordination normal.     Gait: Gait normal.     Comments: Patient has a normal neurologic exam.  He is ambulatory in the room without any difficulty.  He is speaking normally.  Psychiatric:        Mood and Affect: Mood normal.     ED Results / Procedures / Treatments   Labs (all labs ordered are listed, but only abnormal results are displayed) Labs Reviewed  BASIC METABOLIC PANEL - Abnormal; Notable for the following components:      Result Value   Potassium 3.4 (*)    Glucose, Bld 103 (*)    Calcium 8.8 (*)    Anion gap 4 (*)    All other components within normal limits  CBC  URINALYSIS, ROUTINE W REFLEX MICROSCOPIC  CBG MONITORING, ED    ED ECG REPORT   Date: 01/14/2023  Rate: 102  Rhythm: sinus tachycardia  QRS Axis: normal  Intervals: normal  ST/T Wave abnormalities: nonspecific ST/T changes  Conduction Disutrbances:none  Narrative Interpretation:   Old EKG Reviewed: changes noted, faster at this visit, previous T wave inversion in lead III memoir upper rate  I have personally reviewed the EKG tracing and agree with the computerized printout as noted.   Radiology No results found.  Procedures Procedures    Medications Ordered in ED Medications - No data to display  ED Course/ Medical Decision Making/ A&P    Patient seen and examined. History obtained directly from patient.  I reviewed previous ED visits as well as previous head CT results.  Labs/EKG: CBC unremarkable; BMP with minimally low potassium at  3.4, calcium slightly low at 8.8 otherwise unremarkable; UA without significant dehydration or signs of infection.  Imaging: None ordered  Medications/Fluids: None ordered  Most recent vital signs reviewed and are as follows: BP (!) 142/91   Pulse 92   Temp 97.8 F (36.6 C) (Oral)   Resp 17   Ht 6' 5 (1.956 m)  Wt 116.8 kg   SpO2 98%   BMI 30.53 kg/m   Initial impression: Recurrent headache with associated syncope.  Headache now resolving.  Patient has had negative head CT's with similar presentations in the past.  He has a completely normal neurologic exam.  His heart rate is now normal and I do not hear any murmur.  No concerning family history.  Feel patient is low risk syncope.  I do agree with outpatient neurology follow-up which he has scheduled for February.  He has been in the emergency department for 11 hours without worsening and is continuing to improve.  Plan: Discharge to home.   Prescriptions written for: None  Other home care instructions discussed: Encouraged rest and hydration today  ED return instructions discussed: Development of chest pain, any focal neurologic deficits, symptom pattern different than previous with headache and syncopal spell.  Follow-up instructions discussed: Patient encouraged to follow-up with their PCP in 3 days.                                 Medical Decision Making  In regards to the patient's headache, critical differentials were considered including subarachnoid hemorrhage, intracerebral hemorrhage, epidural/subdural hematoma, pituitary apoplexy, vertebral/carotid artery dissection, giant cell arteritis, central venous thrombosis, reversible cerebral vasoconstriction, acute angle closure glaucoma, idiopathic intracranial hypertension, bacterial meningitis, viral encephalitis, carbon monoxide poisoning, posterior reversible encephalopathy syndrome, pre-eclampsia.   Reg flag symptoms related to these causes were considered including  systemic symptoms (fever, weight loss), neurologic symptoms (confusion, mental status change, vision change, associated seizure), acute or sudden thunderclap onset, patient age 54 or older with new or progressive headache, patient of any age with first headache or change in headache pattern, pregnant or postpartum status, history of HIV or other immunocompromise, history of cancer, headache occurring with exertion, associated neck or shoulder pain, associated traumatic injury, concurrent use of anticoagulation, family history of spontaneous SAH, and concurrent drug use.    Other benign, more common causes of headache were considered including migraine, tension-type headache, cluster headache, referred pain from other cause such as sinus infection, dental pain, trigeminal neuralgia.   On exam, patient has a reassuring neuro exam including baseline mental status, no significant neck pain or meningeal signs, no signs of severe infection or fever.   In addition patient with syncopal spell in relation to the headache.  This is not the first time the patient has had the symptoms.  Imaging has been negative in this regard in the past.  Given his reassuring exam, do not feel that he requires MRI on an emergent basis today.  Low concern for cardiogenic syncope.  No arrhythmias or concerning family history.  Patient does not have associated chest pain/SOB to suggest PE, myocarditis, pericarditis.  The patient's vital signs, pertinent lab work and imaging were reviewed and interpreted as discussed in the ED course. Hospitalization was considered for further testing, treatments, or serial exams/observation. However as patient is well-appearing, has a stable exam over the course of their evaluation, and reassuring studies today, I do not feel that they warrant admission at this time. This plan was discussed with the patient who verbalizes agreement and comfort with this plan and seems reliable and able to return to the  Emergency Department with worsening or changing symptoms.          Final Clinical Impression(s) / ED Diagnoses Final diagnoses:  Syncope, unspecified syncope type  Acute nonintractable headache, unspecified  headache type    Rx / DC Orders ED Discharge Orders     None         Desiderio Chew, PA-C 01/14/23 0720    Levander Houston, MD 01/16/23 1028

## 2023-01-14 NOTE — Discharge Instructions (Signed)
 Please read and follow all provided instructions.  Your diagnoses today include:  1. Syncope, unspecified syncope type   2. Acute nonintractable headache, unspecified headache type     Tests performed today include: An EKG of your heart Blood counts and electrolytes Vital signs. See below for your results today.   Medications prescribed:  None  Take any prescribed medications only as directed.  Follow-up instructions: Please follow-up with your primary care provider as soon as you can for further evaluation of your symptoms. Also please keep your appointment with neurology.   Return instructions:  SEEK IMMEDIATE MEDICAL ATTENTION IF: You have severe chest pain, especially if the pain is crushing or pressure-like and spreads to the arms, back, neck, or jaw, or if you have sweating, nausea or vomiting, or trouble with breathing. THIS IS AN EMERGENCY. Do not wait to see if the pain will go away. Get medical help at once. Call 911. DO NOT drive yourself to the hospital.  Return if you have weakness in your arms or legs, slurred speech, trouble walking or talking, confusion, or trouble with your balance.  Your chest pain gets worse and does not go away after a few minutes of rest.  You have an attack of chest pain lasting longer than what you usually experience.  You have significant dizziness, if you pass out, or have trouble walking.  You have chest pain not typical of your usual pain for which you originally saw your caregiver.  You have any other emergent concerns regarding your health.  Your vital signs today were: BP 137/77 (BP Location: Right Arm)   Pulse 89   Temp 98.1 F (36.7 C) (Oral)   Resp 18   SpO2 95%  If your blood pressure (BP) was elevated above 135/85 this visit, please have this repeated by your doctor within one month. --------------

## 2023-01-25 ENCOUNTER — Other Ambulatory Visit: Payer: Self-pay

## 2023-01-25 ENCOUNTER — Emergency Department (HOSPITAL_BASED_OUTPATIENT_CLINIC_OR_DEPARTMENT_OTHER): Payer: BLUE CROSS/BLUE SHIELD

## 2023-01-25 ENCOUNTER — Emergency Department (HOSPITAL_BASED_OUTPATIENT_CLINIC_OR_DEPARTMENT_OTHER)
Admission: EM | Admit: 2023-01-25 | Discharge: 2023-01-25 | Disposition: A | Discharge status: Home / Self Care | Payer: BLUE CROSS/BLUE SHIELD | Attending: Emergency Medicine | Admitting: Emergency Medicine

## 2023-01-25 ENCOUNTER — Encounter (HOSPITAL_BASED_OUTPATIENT_CLINIC_OR_DEPARTMENT_OTHER): Payer: Self-pay | Admitting: Emergency Medicine

## 2023-01-25 DIAGNOSIS — G43809 Other migraine, not intractable, without status migrainosus: Secondary | ICD-10-CM | POA: Diagnosis not present

## 2023-01-25 DIAGNOSIS — R519 Headache, unspecified: Secondary | ICD-10-CM | POA: Diagnosis present

## 2023-01-25 DIAGNOSIS — R202 Paresthesia of skin: Secondary | ICD-10-CM | POA: Diagnosis not present

## 2023-01-25 MED ORDER — IBUPROFEN 600 MG PO TABS
600.0000 mg | ORAL_TABLET | Freq: Three times a day (TID) | ORAL | 0 refills | Status: AC | PRN
Start: 2023-01-25 — End: 2023-02-24

## 2023-01-25 MED ORDER — METOCLOPRAMIDE HCL 10 MG PO TABS
10.0000 mg | ORAL_TABLET | Freq: Once | ORAL | Status: AC
  Administered 2023-01-25: 10 mg via ORAL
  Filled 2023-01-25: qty 1

## 2023-01-25 MED ORDER — IBUPROFEN 800 MG PO TABS
800.0000 mg | ORAL_TABLET | Freq: Once | ORAL | Status: AC
  Administered 2023-01-25: 800 mg via ORAL
  Filled 2023-01-25: qty 1

## 2023-01-25 MED ORDER — DIPHENHYDRAMINE HCL 25 MG PO CAPS
25.0000 mg | ORAL_CAPSULE | Freq: Once | ORAL | Status: AC
  Administered 2023-01-25: 25 mg via ORAL
  Filled 2023-01-25: qty 1

## 2023-01-25 NOTE — ED Provider Notes (Signed)
 [place] EMERGENCY DEPARTMENT AT [facility] Provider Note   CSN: [id number] Arrival date & time: 01/25/23  1941     History  Chief Complaint  Patient presents with   Headache    Brett Taylor is a 28 y.o. male presenting to the ED with several concerns.  The patient has a history of chronic ongoing headaches for approximately 2 years, and a near daily basis.  He was having a more severe headache this evening.  His mom notes that the patient was trembling when it was happening, and he looked to give his got a pass out.  Patient reports he had some blurry spot in the vision of the left eye which she has had regularly with the headaches in the past.  His headache is since improved but not gone away.  He reports he did have some paresthesias on the left half of his face which have improved.  He says he typically just takes Tylenol  at home for his migraines, does not use NSAIDs or any other medications.  He does not drink caffeine, or smoke  He has an appointment coming up with neurology in February.    HPI     Home Medications Prior to Admission medications   Medication Sig Start Date End Date Taking? Authorizing Provider  ibuprofen  (ADVIL ) 600 MG tablet Take 1 tablet (600 mg total) by mouth every 8 (eight) hours as needed for headache. 01/25/23 02/24/23 Yes Bryton Romagnoli, Donnice PARAS, MD  hydrOXYzine  (ATARAX ) 25 MG tablet Take 1 tablet (25 mg total) by mouth every 6 (six) hours as needed for anxiety. 11/29/22   Kommor, Lum, MD      Allergies    Patient has no known allergies.    Review of Systems   Review of Systems  Physical Exam Updated Vital Signs BP 125/86 (BP Location: Left Arm)   Pulse 98   Temp 98.2 F (36.8 C)   Resp 18   Ht 6' 5 (1.956 m)   Wt 116.8 kg   SpO2 95%   BMI 30.53 kg/m  Physical Exam Constitutional:      General: He is not in acute distress. HENT:     Head: Normocephalic and atraumatic.  Eyes:     Conjunctiva/sclera: Conjunctivae normal.      Pupils: Pupils are equal, round, and reactive to light.  Cardiovascular:     Rate and Rhythm: Normal rate and regular rhythm.  Pulmonary:     Effort: Pulmonary effort is normal. No respiratory distress.  Abdominal:     General: There is no distension.     Tenderness: There is no abdominal tenderness.  Skin:    General: Skin is warm and dry.  Neurological:     General: No focal deficit present.     Mental Status: He is alert. Mental status is at baseline.     GCS: GCS eye subscore is 4. GCS verbal subscore is 5. GCS motor subscore is 6.  Psychiatric:        Mood and Affect: Mood normal.        Behavior: Behavior normal.     ED Results / Procedures / Treatments   Labs (all labs ordered are listed, but only abnormal results are displayed) Labs Reviewed - No data to display  EKG EKG Interpretation Date/Time:  Sunday January 25 2023 19:52:14 EST Ventricular Rate:  102 PR Interval:  166 QRS Duration:  82 QT Interval:  312 QTC Calculation: 406 R Axis:   65  Text  Interpretation: Sinus tachycardia When compared with ECG of 13-Jan-2023 20:27, PREVIOUS ECG IS PRESENT Confirmed by Cottie Cough [phone]) on 01/25/2023 9:55:10 PM  Radiology CT Head Wo Contrast Result Date: 01/25/2023 CLINICAL DATA:  Headache, increasing frequency or severity EXAM: CT HEAD WITHOUT CONTRAST TECHNIQUE: Contiguous axial images were obtained from the base of the skull through the vertex without intravenous contrast. RADIATION DOSE REDUCTION: This exam was performed according to the departmental dose-optimization program which includes automated exposure control, adjustment of the mA and/or kV according to patient size and/or use of iterative reconstruction technique. COMPARISON:  CT head 12/25/2022 FINDINGS: Brain: No evidence of large-territorial acute infarction. No parenchymal hemorrhage. No mass lesion. No extra-axial collection. No mass effect or midline shift. No hydrocephalus. Basilar cisterns are  patent. Septum pellucidum variant. Vascular: No hyperdense vessel. Skull: No acute fracture or focal lesion. Sinuses/Orbits: Paranasal sinuses and mastoid air cells are clear. The orbits are unremarkable. Other: None. IMPRESSION: No acute intracranial abnormality. Electronically Signed   By: Morgane  Naveau M.D.   On: 01/25/2023 20:19    Procedures Procedures    Medications Ordered in ED Medications  ibuprofen  (ADVIL ) tablet 800 mg (has no administration in time range)  metoCLOPramide  (REGLAN ) tablet 10 mg (has no administration in time range)  diphenhydrAMINE  (BENADRYL ) capsule 25 mg (has no administration in time range)    ED Course/ Medical Decision Making/ A&P                                 Medical Decision Making Risk Prescription drug management.   Patient was presented to the of the headache.  I strongly suspect this a migraine, possibly complex migraine, with a visual aura.  He does not have any acute neurological deficits to raise concern for stroke at this time.  Nor does he have stroke risk factors.  Doubt subarachnoid hemorrhage.  His headache was not thunderclap onset, and is improving.  He was given oral migraine medications in the ED.  He would benefit from follow-up with a neurologist.  I also discussed use of NSAIDs at home which may help his headache pain better than simple Tylenol .  The patient and his mother are in agreement and content with this plan.  CT imaging was ordered from triage on arrival today for atypical headache, which was personally reviewed, with no emergent findings.  His EKG per my interpretation shows sinus rhythm no acute ischemic findings        Final Clinical Impression(s) / ED Diagnoses Final diagnoses:  Other migraine without status migrainosus, not intractable    Rx / DC Orders ED Discharge Orders          Ordered    ibuprofen  (ADVIL ) 600 MG tablet  Every 8 hours PRN        01/25/23 2155              Cottie Cough PARAS,  MD 01/25/23 2156
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
## 2023-01-25 NOTE — Discharge Instructions (Addendum)
 You will need to follow-up with a neurologist, or headache specialist in February.  In the meantime, we will need to have a headache and continue using Tylenol but also consider taking ibuprofen that was prescribed.
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
## 2023-01-29 ENCOUNTER — Other Ambulatory Visit: Payer: Self-pay

## 2023-01-29 ENCOUNTER — Emergency Department (HOSPITAL_COMMUNITY)
Admission: EM | Admit: 2023-01-29 | Discharge: 2023-01-30 | Disposition: A | Discharge status: Home / Self Care | Payer: BLUE CROSS/BLUE SHIELD | Attending: Emergency Medicine | Admitting: Emergency Medicine

## 2023-01-29 ENCOUNTER — Encounter (HOSPITAL_COMMUNITY): Payer: Self-pay

## 2023-01-29 DIAGNOSIS — E860 Dehydration: Secondary | ICD-10-CM | POA: Insufficient documentation

## 2023-01-29 DIAGNOSIS — Z1152 Encounter for screening for COVID-19: Secondary | ICD-10-CM | POA: Diagnosis not present

## 2023-01-29 DIAGNOSIS — R079 Chest pain, unspecified: Secondary | ICD-10-CM | POA: Diagnosis present

## 2023-01-29 DIAGNOSIS — R0789 Other chest pain: Secondary | ICD-10-CM | POA: Diagnosis not present

## 2023-01-29 DIAGNOSIS — R55 Syncope and collapse: Secondary | ICD-10-CM | POA: Insufficient documentation

## 2023-01-29 DIAGNOSIS — R112 Nausea with vomiting, unspecified: Secondary | ICD-10-CM | POA: Diagnosis present

## 2023-01-29 DIAGNOSIS — B349 Viral infection, unspecified: Secondary | ICD-10-CM | POA: Diagnosis not present

## 2023-01-29 LAB — URINALYSIS, ROUTINE W REFLEX MICROSCOPIC
Bacteria, UA: NONE SEEN
Bilirubin Urine: NEGATIVE
Glucose, UA: NEGATIVE mg/dL
Ketones, ur: 20 mg/dL — AB
Leukocytes,Ua: NEGATIVE
Nitrite: NEGATIVE
Protein, ur: 30 mg/dL — AB
Specific Gravity, Urine: 1.028 (ref 1.005–1.030)
pH: 5 (ref 5.0–8.0)

## 2023-01-29 LAB — CBC
HCT: 46.3 % (ref 39.0–52.0)
Hemoglobin: 15.1 g/dL (ref 13.0–17.0)
MCH: 27.8 pg (ref 26.0–34.0)
MCHC: 32.6 g/dL (ref 30.0–36.0)
MCV: 85.1 fL (ref 80.0–100.0)
Platelets: 267 10*3/uL (ref 150–400)
RBC: 5.44 MIL/uL (ref 4.22–5.81)
RDW: 12.7 % (ref 11.5–15.5)
WBC: 3.5 10*3/uL — ABNORMAL LOW (ref 4.0–10.5)
nRBC: 0 % (ref 0.0–0.2)

## 2023-01-29 LAB — COMPREHENSIVE METABOLIC PANEL
ALT: 14 U/L (ref 0–44)
AST: 14 U/L — ABNORMAL LOW (ref 15–41)
Albumin: 4 g/dL (ref 3.5–5.0)
Alkaline Phosphatase: 54 U/L (ref 38–126)
Anion gap: 9 (ref 5–15)
BUN: 10 mg/dL (ref 6–20)
CO2: 24 mmol/L (ref 22–32)
Calcium: 9.3 mg/dL (ref 8.9–10.3)
Chloride: 107 mmol/L (ref 98–111)
Creatinine, Ser: 1.02 mg/dL (ref 0.61–1.24)
GFR, Estimated: >60 mL/min (ref >60)
Glucose, Bld: 95 mg/dL (ref 70–99)
Potassium: 3.9 mmol/L (ref 3.5–5.1)
Sodium: 140 mmol/L (ref 135–145)
Total Bilirubin: 1.1 mg/dL (ref 0.0–1.2)
Total Protein: 7.3 g/dL (ref 6.5–8.1)

## 2023-01-29 LAB — LIPASE, BLOOD: Lipase: 27 U/L (ref 11–51)

## 2023-01-29 NOTE — ED Notes (Signed)
Pt states that he is feeling really nauseous and that he went and threw up in the restroom a little bit.

## 2023-01-29 NOTE — ED Triage Notes (Signed)
BIBA c/o intermittent  n/v and headache x2 weeks Syncopal episode last night and was orthostatic + and refused EMS.  1L NS with EMS

## 2023-01-30 ENCOUNTER — Emergency Department (HOSPITAL_COMMUNITY): Payer: BLUE CROSS/BLUE SHIELD

## 2023-01-30 ENCOUNTER — Other Ambulatory Visit: Payer: Self-pay

## 2023-01-30 DIAGNOSIS — R112 Nausea with vomiting, unspecified: Secondary | ICD-10-CM | POA: Insufficient documentation

## 2023-01-30 DIAGNOSIS — E860 Dehydration: Secondary | ICD-10-CM | POA: Insufficient documentation

## 2023-01-30 DIAGNOSIS — R0789 Other chest pain: Secondary | ICD-10-CM | POA: Insufficient documentation

## 2023-01-30 DIAGNOSIS — R55 Syncope and collapse: Secondary | ICD-10-CM | POA: Insufficient documentation

## 2023-01-30 DIAGNOSIS — B349 Viral infection, unspecified: Secondary | ICD-10-CM | POA: Insufficient documentation

## 2023-01-30 DIAGNOSIS — Z1152 Encounter for screening for COVID-19: Secondary | ICD-10-CM | POA: Insufficient documentation

## 2023-01-30 LAB — CBC
HCT: 41.8 % (ref 39.0–52.0)
Hemoglobin: 14.2 g/dL (ref 13.0–17.0)
MCH: 27.9 pg (ref 26.0–34.0)
MCHC: 34 g/dL (ref 30.0–36.0)
MCV: 82.1 fL (ref 80.0–100.0)
Platelets: 275 10*3/uL (ref 150–400)
RBC: 5.09 MIL/uL (ref 4.22–5.81)
RDW: 12.7 % (ref 11.5–15.5)
WBC: 3.2 10*3/uL — ABNORMAL LOW (ref 4.0–10.5)
nRBC: 0 % (ref 0.0–0.2)

## 2023-01-30 LAB — RESP PANEL BY RT-PCR (RSV, FLU A&B, COVID)  RVPGX2
Influenza A by PCR: NEGATIVE
Influenza B by PCR: NEGATIVE
Resp Syncytial Virus by PCR: NEGATIVE
SARS Coronavirus 2 by RT PCR: NEGATIVE

## 2023-01-30 LAB — BASIC METABOLIC PANEL
Anion gap: 9 (ref 5–15)
BUN: 10 mg/dL (ref 6–20)
CO2: 24 mmol/L (ref 22–32)
Calcium: 9.6 mg/dL (ref 8.9–10.3)
Chloride: 106 mmol/L (ref 98–111)
Creatinine, Ser: 1.02 mg/dL (ref 0.61–1.24)
GFR, Estimated: >60 mL/min (ref >60)
Glucose, Bld: 109 mg/dL — ABNORMAL HIGH (ref 70–99)
Potassium: 3.5 mmol/L (ref 3.5–5.1)
Sodium: 139 mmol/L (ref 135–145)

## 2023-01-30 LAB — TROPONIN I (HIGH SENSITIVITY): Troponin I (High Sensitivity): 5 ng/L (ref <18)

## 2023-01-30 MED ORDER — SODIUM CHLORIDE 0.9 % IV BOLUS
1000.0000 mL | Freq: Once | INTRAVENOUS | Status: AC
  Administered 2023-01-30: 1000 mL via INTRAVENOUS

## 2023-01-30 MED ORDER — IOHEXOL 350 MG/ML SOLN
75.0000 mL | Freq: Once | INTRAVENOUS | Status: AC | PRN
  Administered 2023-01-30: 75 mL via INTRAVENOUS

## 2023-01-30 MED ORDER — SODIUM CHLORIDE (PF) 0.9 % IJ SOLN
INTRAMUSCULAR | Status: AC
  Filled 2023-01-30: qty 50

## 2023-01-30 MED ORDER — ONDANSETRON 4 MG PO TBDP: 4.0000 mg | ORAL_TABLET | Freq: Three times a day (TID) | ORAL | 0 refills | Status: AC | PRN

## 2023-01-30 NOTE — ED Triage Notes (Signed)
Pt BIB [facility] from home d/t dizziness since 0800.  Ems went to his house earlier and told him to drink water but did not feel better.  Pt was seen at [facility] this week for same.

## 2023-01-30 NOTE — ED Notes (Signed)
Pt complaining of being nauseated, no other complaints at this time.

## 2023-01-30 NOTE — ED Triage Notes (Signed)
CP, dizziness, diaphoretic, cough. Since yesterday.

## 2023-01-30 NOTE — ED Notes (Signed)
Pt called this Clinical research associate in, pt seemed anxious and jittery. Pt instructed to place right hand on chest, and when he felt his heart beat through his hand, to start counting the beats of his heart. Pt instructed to breathe in through his nose and out through his mouth. Pt given extra warm blankets due to pt shivering. Pt seem to be having a panic attack and RN made aware of situation. This Clinical research associate told pt she would come back in and check on him in about 10 minutes.

## 2023-01-30 NOTE — Discharge Instructions (Addendum)
As we discussed, your workup in the ER today was reassuring for acute findings.  CT imaging and laboratory evaluation did not reveal any emergent cause of your symptoms.  It is very important that you maintain adequate oral hydration with fluids specifically high in the electrolytes.  It is also extremely important that you see neurology in February.  I have given you a prescription for Zofran for you to take for any residual nausea or vomiting.  Return if development of any new or worsening symptoms.

## 2023-01-30 NOTE — ED Provider Notes (Signed)
[place] EMERGENCY DEPARTMENT AT [facility] Provider Note   CSN: [id number] Arrival date & time: 01/29/23  1807     History  Chief Complaint  Patient presents with   Emesis    Brett Taylor is a 28 y.o. male.  Patient with no pertinent past medical history presents today with complaints of nausea, vomiting, and syncope.  He states that he he has been nauseous and vomiting off and on for the past several weeks.  Also notes that he does not have much of an appetite.  Over the last few days he has begun to feel weak and lightheaded.  States that he is symptomatic when he stands up.  States that he had a syncopal episode yesterday evening upon standing and EMS was called to his house last night due to the symptoms and he was told that his blood pressure dropped when he stood up and that he needed to come to the hospital for evaluation.  He did not hit his head or lose consciousness during this event.  He has not anticoagulated.  Denies any injuries.  He received 1 L of fluids with EMS and has been trying to rehydrate himself with Gatorade, however states that he is still symptomatic and therefore presents for evaluation of same.  He also notes that he has a headache.  States he has had persistent headaches for the past 2 years.  Does note that they have increased in frequency.  He has had several negative head CTs recently.  He has an appointment with neurology in February for follow-up.  Headache feels consistent with his previous headaches.  Denies vision changes, unilateral weakness or numbness, chest pain, shortness of breath, or abdominal pain.  No fevers or chills.  The history is provided by the patient. No language interpreter was used.  Emesis Associated symptoms: headaches        Home Medications Prior to Admission medications   Medication Sig Start Date End Date Taking? Authorizing Provider  hydrOXYzine (ATARAX) 25 MG tablet Take 1 tablet (25 mg total) by mouth  every 6 (six) hours as needed for anxiety. 11/29/22   Kommor, Wyn Forster, MD  ibuprofen (ADVIL) 600 MG tablet Take 1 tablet (600 mg total) by mouth every 8 (eight) hours as needed for headache. 01/25/23 02/24/23  Trifan, Kermit Balo, MD      Allergies    Patient has no known allergies.    Review of Systems   Review of Systems  Gastrointestinal:  Positive for nausea and vomiting.  Neurological:  Positive for headaches.  All other systems reviewed and are negative.   Physical Exam Updated Vital Signs BP 131/83 (BP Location: Right Arm)   Pulse 98   Temp 98 F (36.7 C) (Oral)   Resp 18   SpO2 99%  Physical Exam Vitals and nursing note reviewed.  Constitutional:      General: He is not in acute distress.    Appearance: Normal appearance. He is normal weight. He is not ill-appearing, toxic-appearing or diaphoretic.  HENT:     Head: Normocephalic and atraumatic.  Eyes:     Extraocular Movements: Extraocular movements intact.     Pupils: Pupils are equal, round, and reactive to light.  Neck:     Comments: No meningismus Cardiovascular:     Rate and Rhythm: Normal rate and regular rhythm.     Heart sounds: Normal heart sounds.  Pulmonary:     Effort: Pulmonary effort is normal. No respiratory distress.  Breath sounds: Normal breath sounds.  Abdominal:     General: Abdomen is flat.     Palpations: Abdomen is soft.     Tenderness: There is no abdominal tenderness.  Musculoskeletal:        General: Normal range of motion.     Cervical back: Normal range of motion and neck supple.     Right lower leg: No edema.     Left lower leg: No edema.  Skin:    General: Skin is warm and dry.  Neurological:     General: No focal deficit present.     Mental Status: He is alert and oriented to person, place, and time.     GCS: GCS eye subscore is 4. GCS verbal subscore is 5. GCS motor subscore is 6.     Sensory: Sensation is intact.     Motor: Motor function is intact.     Coordination:  Coordination is intact.     Gait: Gait is intact.     Comments: Alert and oriented to self, place, time and event.    Speech is fluent, clear without dysarthria or dysphasia.    Strength 5/5 in upper/lower extremities   Sensation intact in upper/lower extremities    CN I not tested  CN II grossly intact visual fields bilaterally. Did not visualize posterior eye.  CN III, IV, VI PERRLA and EOMs intact bilaterally  CN V Intact sensation to sharp and light touch to the face  CN VII facial movements symmetric  CN VIII not tested  CN IX, X no uvula deviation, symmetric rise of soft palate  CN XI 5/5 SCM and trapezius strength bilaterally  CN XII Midline tongue protrusion, symmetric L/R movements   Psychiatric:        Mood and Affect: Mood normal.        Behavior: Behavior normal.     ED Results / Procedures / Treatments   Labs (all labs ordered are listed, but only abnormal results are displayed) Labs Reviewed  COMPREHENSIVE METABOLIC PANEL - Abnormal; Notable for the following components:      Result Value   AST 14 (*)    All other components within normal limits  CBC - Abnormal; Notable for the following components:   WBC 3.5 (*)    All other components within normal limits  URINALYSIS, ROUTINE W REFLEX MICROSCOPIC - Abnormal; Notable for the following components:   Hgb urine dipstick SMALL (*)    Ketones, ur 20 (*)    Protein, ur 30 (*)    All other components within normal limits  LIPASE, BLOOD    EKG None  Radiology No results found.  Procedures Procedures    Medications Ordered in ED Medications  sodium chloride 0.9 % bolus 1,000 mL (1,000 mLs Intravenous New Bag/Given 01/30/23 0428)    ED Course/ Medical Decision Making/ A&P                                 Medical Decision Making Amount and/or Complexity of Data Reviewed Labs: ordered. Radiology: ordered.   This patient is a 28 y.o. male who presents to the ED for concern of nausea, vomiting,  syncope, this involves an extensive number of treatment options, and is a complaint that carries with it a high risk of complications and morbidity. The emergent differential diagnosis prior to evaluation includes, but is not limited to,  CVA, ACS, arrhythmia, vasovagal syncope, orthostatic  hypotension, sepsis, hypoglycemia, electrolyte disturbance, respiratory failure, symptomatic anemia, dehydration, heat injury, polypharmacy, malignancy, anxiety/panic attack.    This is not an exhaustive differential.   Past Medical History / Co-morbidities / Social History:  has a past medical history of ADHD (attention deficit hyperactivity disorder), Anxiety, Anxiety, Depression, History of heart murmur in childhood, and Wears glasses.  Additional history: Chart reviewed. Pertinent results include: numerous previous negative head CTs  Physical Exam: Physical exam performed. The pertinent findings include: Oriented and neurologically intact without focal deficits.  Lab Tests: I ordered, and personally interpreted labs.  The pertinent results include: Ketonuria.  No other acute laboratory abnormalities.   Imaging Studies: I ordered imaging studies including CTA head and neck. I independently visualized and interpreted imaging which showed   1. Intracranial motion artifact at and above the level of the circle-of-Willis. 2. No emergent finding or explanation for headache.  I agree with the radiologist interpretation.   Cardiac Monitoring:  The patient was maintained on a cardiac monitor.  My attending physician Dr. Manus Gunning viewed and interpreted the cardiac monitored which showed an underlying rhythm of: no stemi. I agree with this interpretation.   Medications: I ordered medication including fluids  for dehydration. Reevaluation of the patient after these medicines showed that the patient improved. I have reviewed the patients home medicines and have made adjustments as needed.    Disposition: After consideration of the diagnostic results and the patients response to treatment, I feel that emergency department workup does not suggest an emergent condition requiring admission or immediate intervention beyond what has been performed at this time. The plan is: Discharge with close outpatient follow-up and return precautions.  Patient's workup is benign.  After fluids, he feels substantially improved and ready to go home.  No residual orthostatic hypotension.  He is able to walk around without any dizziness.  He is tolerating p.o. intake without any residual nausea or vomiting.  Will send for Zofran to cover for nausea.  Emphasized importance of seeing his neurologist in February.  No neurologic deficits to indicate MRI at this time.  No meningismus.  Evaluation and diagnostic testing in the emergency department does not suggest an emergent condition requiring admission or immediate intervention beyond what has been performed at this time.  Plan for discharge with close PCP follow-up.  Patient is understanding and amenable with plan, educated on red flag symptoms that would prompt immediate return.  Patient discharged in stable condition.  Final Clinical Impression(s) / ED Diagnoses Final diagnoses:  Nausea and vomiting, unspecified vomiting type  Dehydration    Rx / DC Orders ED Discharge Orders          Ordered    ondansetron (ZOFRAN-ODT) 4 MG disintegrating tablet  Every 8 hours PRN        01/30/23 0630          An After Visit Summary was printed and given to the patient.     Vear Clock 01/30/23 [id number]    Glynn Octave, MD 01/30/23 [phone]

## 2023-01-30 NOTE — ED Provider Triage Note (Signed)
Emergency Medicine Provider Triage Evaluation Note  Brett Taylor , a 28 y.o. male  was evaluated in triage.  Pt complains of nausea, vomiting, syncope. He states that he has been nauseous and vomiting for the past several weeks and began to feel weak and lightheaded in the past few days. States that last night, EMS was called to his house and they told him he was dangerously dehydrated and recommended that he come here for evaluation, he refused transport at that time.  He has been trying to drink Gatorade rehydrate himself.  States he has not had an appetite for the last week.  Does note that he has a headache, however he states he has.  Had a headache for the last year and has had numerous CT scans without any acute findings.  He has an appointment with neurology in February. Denies any new changes in this headache today. No vision changes, unilateral weakness or numbness, chest pain, shortness of breath, abdominal pain.  Review of Systems  Positive:  Negative:   Physical Exam  BP (!) 137/95 (BP Location: Right Arm)   Pulse 89   Temp 98 F (36.7 C) (Oral)   Resp 19   SpO2 95%  Gen:   Awake, no distress   Resp:  Normal effort  MSK:   Moves extremities without difficulty  Other:    Medical Decision Making  Medically screening exam initiated at 12:41 AM.  Appropriate orders placed.  Brett Taylor was informed that the remainder of the evaluation will be completed by another provider, this initial triage assessment does not replace that evaluation, and the importance of remaining in the ED until their evaluation is complete.     Silva Bandy, PA-C 01/30/23 [phone]

## 2023-01-30 NOTE — ED Notes (Signed)
This Clinical research associate rechecked on pt. Pt breathing WNL, pt not shivering, pt resting with his head back on the bed. This Clinical research associate asked if pt was feeling better, pt responded yes thank you. RN made aware.

## 2023-01-31 ENCOUNTER — Emergency Department (HOSPITAL_BASED_OUTPATIENT_CLINIC_OR_DEPARTMENT_OTHER)
Admission: EM | Admit: 2023-01-31 | Discharge: 2023-01-31 | Disposition: A | Discharge status: Home / Self Care | Payer: BLUE CROSS/BLUE SHIELD | Source: Home / Self Care | Attending: Emergency Medicine | Admitting: Emergency Medicine

## 2023-01-31 ENCOUNTER — Emergency Department (HOSPITAL_BASED_OUTPATIENT_CLINIC_OR_DEPARTMENT_OTHER): Payer: BLUE CROSS/BLUE SHIELD

## 2023-01-31 DIAGNOSIS — R0789 Other chest pain: Secondary | ICD-10-CM

## 2023-01-31 DIAGNOSIS — B349 Viral infection, unspecified: Secondary | ICD-10-CM

## 2023-01-31 DIAGNOSIS — R112 Nausea with vomiting, unspecified: Secondary | ICD-10-CM | POA: Diagnosis not present

## 2023-01-31 NOTE — Discharge Instructions (Signed)
Drink plenty of fluids and get plenty of rest.  Take ibuprofen 600 mg every 6 hours as needed for pain.  Return to the emergency department if your symptoms significantly worsen or change.

## 2023-01-31 NOTE — ED Notes (Signed)
Confirmed with MD no need for second troponin.

## 2023-01-31 NOTE — ED Provider Notes (Signed)
[place] EMERGENCY DEPARTMENT AT [facility] Provider Note   CSN: [id number] Arrival date & time: 01/30/23  1928     History  Chief Complaint  Patient presents with   Dizziness    Brett Taylor is a 28 y.o. male.  Patient is a 28 year old male with past medical history of anxiety.  Patient presenting today with complaints of cough, chest tightness, and feeling generally unwell.  This is been ongoing for the past several days.  He was seen at [facility] yesterday with similar complaints.  He underwent CT scan of the head and laboratory studies, all of which were unremarkable.  He was given IV fluids and discharged.  Today he began to feel dizzy and had tightness in his chest when he coughed and decided to be reevaluated.  No aggravating or alleviating factors.  The history is provided by the patient.       Home Medications Prior to Admission medications   Medication Sig Start Date End Date Taking? Authorizing Provider  hydrOXYzine (ATARAX) 25 MG tablet Take 1 tablet (25 mg total) by mouth every 6 (six) hours as needed for anxiety. 11/29/22   Kommor, Wyn Forster, MD  ibuprofen (ADVIL) 600 MG tablet Take 1 tablet (600 mg total) by mouth every 8 (eight) hours as needed for headache. 01/25/23 02/24/23  Terald Sleeper, MD  ondansetron (ZOFRAN-ODT) 4 MG disintegrating tablet Take 1 tablet (4 mg total) by mouth every 8 (eight) hours as needed for nausea or vomiting. 01/30/23   Smoot, Shawn Route, PA-C      Allergies    Patient has no known allergies.    Review of Systems   Review of Systems  All other systems reviewed and are negative.   Physical Exam Updated Vital Signs BP 136/84   Pulse 62   Temp 98.3 F (36.8 C) (Oral)   Resp 16   Ht 6\' 5"  (1.956 m)   SpO2 100%   BMI 30.53 kg/m  Physical Exam Vitals and nursing note reviewed.  Constitutional:      General: He is not in acute distress.    Appearance: He is well-developed. He is not diaphoretic.  HENT:      Head: Normocephalic and atraumatic.  Cardiovascular:     Rate and Rhythm: Normal rate and regular rhythm.     Heart sounds: No murmur heard.    No friction rub.  Pulmonary:     Effort: Pulmonary effort is normal. No respiratory distress.     Breath sounds: Normal breath sounds. No wheezing or rales.  Abdominal:     General: Bowel sounds are normal. There is no distension.     Palpations: Abdomen is soft.     Tenderness: There is no abdominal tenderness.  Musculoskeletal:        General: Normal range of motion.     Cervical back: Normal range of motion and neck supple.  Skin:    General: Skin is warm and dry.  Neurological:     Mental Status: He is alert and oriented to person, place, and time.     Coordination: Coordination normal.     ED Results / Procedures / Treatments   Labs (all labs ordered are listed, but only abnormal results are displayed) Labs Reviewed  BASIC METABOLIC PANEL - Abnormal; Notable for the following components:      Result Value   Glucose, Bld 109 (*)    All other components within normal limits  CBC - Abnormal; Notable for the  following components:   WBC 3.2 (*)    All other components within normal limits  RESP PANEL BY RT-PCR (RSV, FLU A&B, COVID)  RVPGX2  TROPONIN I (HIGH SENSITIVITY)  TROPONIN I (HIGH SENSITIVITY)    EKG ED ECG REPORT   Date: 01/31/2023  Rate: 102  Rhythm: sinus tachycardia  QRS Axis: normal  Intervals: normal  ST/T Wave abnormalities: normal  Conduction Disutrbances:none  Narrative Interpretation:   Old EKG Reviewed: unchanged  I have personally reviewed the EKG tracing and agree with the computerized printout as noted.   Radiology DG Chest Port 1 View Result Date: 01/31/2023 CLINICAL DATA:  Chest pain EXAM: PORTABLE CHEST 1 VIEW COMPARISON:  None Available. FINDINGS: The heart size and mediastinal contours are within normal limits. Both lungs are clear. The visualized skeletal structures are unremarkable.  IMPRESSION: No active disease. Electronically Signed   By: Helyn Numbers M.D.   On: 01/31/2023 00:25   CT ANGIO HEAD NECK W WO CM Result Date: 01/30/2023 CLINICAL DATA:  Headache for 2 weeks with nausea and vomiting. Stroke suspected. EXAM: CT ANGIOGRAPHY HEAD AND NECK WITH AND WITHOUT CONTRAST TECHNIQUE: Multidetector CT imaging of the head and neck was performed using the standard protocol during bolus administration of intravenous contrast. Multiplanar CT image reconstructions and MIPs were obtained to evaluate the vascular anatomy. Carotid stenosis measurements (when applicable) are obtained utilizing NASCET criteria, using the distal internal carotid diameter as the denominator. RADIATION DOSE REDUCTION: This exam was performed according to the departmental dose-optimization program which includes automated exposure control, adjustment of the mA and/or kV according to patient size and/or use of iterative reconstruction technique. CONTRAST:  75mL OMNIPAQUE IOHEXOL 350 MG/ML SOLN COMPARISON:  Head CT from 5 days ago FINDINGS: CT HEAD FINDINGS Brain: No evidence of acute infarction, hemorrhage, hydrocephalus, extra-axial collection or mass lesion/mass effect. Cavum septum pellucidum. Suboptimal detail due to the technique. Dural calcification at the vertex, nonspecific. Vascular: No hyperdense vessel or unexpected calcification. Skull: Negative Sinuses/Orbits: Negative Review of the MIP images confirms the above findings CTA NECK FINDINGS Aortic arch: Normal Right carotid system: Vessels are smoothly contoured and diffusely patent. No atheromatous change. Left carotid system: Vessels are smoothly contoured and diffusely patent. No atheromatous change. Vertebral arteries: Vessels are smoothly contoured and diffusely patent. No atheromatous change. Skeleton: Unremarkable Other neck: Negative Upper chest: Clear apical lungs. Review of the MIP images confirms the above findings CTA HEAD FINDINGS Anterior  circulation: Motion artifact at and above the circle-of-Willis. No branch occlusion, aneurysm, or detected beading. Posterior circulation: Vertebral and basilar arteries are smoothly contoured and diffusely patent. Motion artifact that is noted above. No evidence of branch occlusion, beading, or aneurysm. Venous sinuses: Unremarkable Anatomic variants: None significant Review of the MIP images confirms the above findings IMPRESSION: 1. Intracranial motion artifact at and above the level of the circle-of-Willis. 2. No emergent finding or explanation for headache. Electronically Signed   By: Tiburcio Pea M.D.   On: 01/30/2023 06:15    Procedures Procedures    Medications Ordered in ED Medications - No data to display  ED Course/ Medical Decision Making/ A&P  Patient is a 28 year old male presenting with chest tightness and dizziness as described in the HPI.  Patient arrives with stable vital signs and is afebrile.  Physical examination is basically unremarkable.  Workup initiated including CBC, metabolic panel, troponin, and respiratory panel.  All studies basically unremarkable.  Chest x-ray is clear.  Patient's presentation most likely related to some sort of  viral infection.  His EKG shows no acute process and laboratory studies are unremarkable.  Troponin is negative.  I highly doubt a cardiac etiology and feel as though patient can safely be discharged.  Final Clinical Impression(s) / ED Diagnoses Final diagnoses:  None    Rx / DC Orders ED Discharge Orders     None         Geoffery Lyons, MD 01/31/23 [phone]

## 2023-02-01 ENCOUNTER — Emergency Department (HOSPITAL_COMMUNITY)
Admission: EM | Admit: 2023-02-01 | Discharge: 2023-02-02 | Disposition: A | Discharge status: Home / Self Care | Payer: BLUE CROSS/BLUE SHIELD | Attending: Emergency Medicine | Admitting: Emergency Medicine

## 2023-02-01 ENCOUNTER — Other Ambulatory Visit: Payer: Self-pay

## 2023-02-01 ENCOUNTER — Encounter (HOSPITAL_COMMUNITY): Payer: Self-pay | Admitting: Emergency Medicine

## 2023-02-01 DIAGNOSIS — R42 Dizziness and giddiness: Secondary | ICD-10-CM | POA: Diagnosis not present

## 2023-02-01 DIAGNOSIS — R519 Headache, unspecified: Secondary | ICD-10-CM | POA: Diagnosis present

## 2023-02-01 LAB — COMPREHENSIVE METABOLIC PANEL
ALT: 13 U/L (ref 0–44)
AST: 20 U/L (ref 15–41)
Albumin: 4.2 g/dL (ref 3.5–5.0)
Alkaline Phosphatase: 54 U/L (ref 38–126)
Anion gap: 14 (ref 5–15)
BUN: 10 mg/dL (ref 6–20)
CO2: 20 mmol/L — ABNORMAL LOW (ref 22–32)
Calcium: 9.9 mg/dL (ref 8.9–10.3)
Chloride: 106 mmol/L (ref 98–111)
Creatinine, Ser: 1.13 mg/dL (ref 0.61–1.24)
GFR, Estimated: >60 mL/min (ref >60)
Glucose, Bld: 96 mg/dL (ref 70–99)
Potassium: 3.6 mmol/L (ref 3.5–5.1)
Sodium: 140 mmol/L (ref 135–145)
Total Bilirubin: 0.9 mg/dL (ref 0.0–1.2)
Total Protein: 7.2 g/dL (ref 6.5–8.1)

## 2023-02-01 LAB — CBC
HCT: 43.4 % (ref 39.0–52.0)
Hemoglobin: 14.6 g/dL (ref 13.0–17.0)
MCH: 28 pg (ref 26.0–34.0)
MCHC: 33.6 g/dL (ref 30.0–36.0)
MCV: 83.3 fL (ref 80.0–100.0)
Platelets: 294 10*3/uL (ref 150–400)
RBC: 5.21 MIL/uL (ref 4.22–5.81)
RDW: 12.6 % (ref 11.5–15.5)
WBC: 5.9 10*3/uL (ref 4.0–10.5)
nRBC: 0 % (ref 0.0–0.2)

## 2023-02-01 LAB — URINALYSIS, ROUTINE W REFLEX MICROSCOPIC
Bilirubin Urine: NEGATIVE
Glucose, UA: NEGATIVE mg/dL
Hgb urine dipstick: NEGATIVE
Ketones, ur: 20 mg/dL — AB
Leukocytes,Ua: NEGATIVE
Nitrite: NEGATIVE
Protein, ur: NEGATIVE mg/dL
Specific Gravity, Urine: 1.032 — ABNORMAL HIGH (ref 1.005–1.030)
pH: 5 (ref 5.0–8.0)

## 2023-02-01 LAB — CBG MONITORING, ED: Glucose-Capillary: 83 mg/dL (ref 70–99)

## 2023-02-01 LAB — LIPASE, BLOOD: Lipase: 29 U/L (ref 11–51)

## 2023-02-01 MED ORDER — ACETAMINOPHEN 325 MG PO TABS
650.0000 mg | ORAL_TABLET | Freq: Once | ORAL | Status: AC
  Administered 2023-02-01: 650 mg via ORAL
  Filled 2023-02-01: qty 2

## 2023-02-01 NOTE — ED Triage Notes (Signed)
Pt arrives via EMS from home with headache that has worsened over the last 4 days. Denies recent fever or sick contacts. Seen at urgent care earlier this week and tested negative for flu, covid and RSV. Pt alert, oriented x4, VSS in transport.

## 2023-02-01 NOTE — ED Provider Triage Note (Signed)
Emergency Medicine Provider Triage Evaluation Note  Brett Taylor , a 28 y.o. male  was evaluated in triage.  Pt complains of headaches for the past week with associated subjective fevers. Has tried OTC analgesics without relief. Was seen at [facility] yesterday and had negative respiratory panel.  Review of Systems  Positive: Headaches Negative: Vomiting, diarrhea  Physical Exam  BP (!) 146/115 (BP Location: Left Arm)   Pulse (!) 107   Temp 98.5 F (36.9 C) (Oral)   Resp 16   Ht 6\' 5"  (1.956 m)   Wt 113.4 kg   SpO2 100%   BMI 29.65 kg/m  Gen:   Awake, no distress   Resp:  Normal effort  MSK:   Moves extremities without difficulty  Other:    Medical Decision Making  Medically screening exam initiated at 7:16 PM.  Appropriate orders placed.  Brett Taylor was informed that the remainder of the evaluation will be completed by another provider, this initial triage assessment does not replace that evaluation, and the importance of remaining in the ED until their evaluation is complete.   Maxwell Marion, PA-C 02/01/23 1918

## 2023-02-02 MED ORDER — METOCLOPRAMIDE HCL 5 MG/ML IJ SOLN: 10.0000 mg | Freq: Once | INTRAMUSCULAR | Status: DC

## 2023-02-02 MED ORDER — KETOROLAC TROMETHAMINE 30 MG/ML IJ SOLN: 30.0000 mg | Freq: Once | INTRAMUSCULAR | Status: DC

## 2023-02-02 MED ORDER — SODIUM CHLORIDE 0.9 % IV BOLUS
1000.0000 mL | Freq: Once | INTRAVENOUS | Status: AC
  Administered 2023-02-02: 1000 mL via INTRAVENOUS

## 2023-02-02 MED ORDER — DIPHENHYDRAMINE HCL 50 MG/ML IJ SOLN: 25.0000 mg | Freq: Once | INTRAMUSCULAR | Status: DC

## 2023-02-02 NOTE — ED Provider Notes (Signed)
[facility] Emergency Department Provider Note MRN:  [id number]  Arrival date & time: 02/02/23     Chief Complaint   Headache and Dizziness   History of Present Illness   Brett Taylor is a 28 y.o. year-old male presents to the ED with chief complaint of frequent headaches.  Hx of the same.  Seen frequently for the same.  Has follow-up with neurology for headaches in a couple of weeks.  States that this headache is similar to priors.  Denies numbness, weakness, or tingling.  Denies fever or chills.  History provided by patient.   Review of Systems  Pertinent positive and negative review of systems noted in HPI.    Physical Exam   Vitals:   02/01/23 2008 02/01/23 2355  BP: 135/86 (!) 138/91  Pulse: 91 75  Resp: 19 17  Temp: 98.3 F (36.8 C) 98.2 F (36.8 C)  SpO2: 97% 100%    CONSTITUTIONAL:  non toxic-appearing, NAD NEURO:  Alert and oriented x 3, CN 3-12 grossly intact, no ataxia EYES:  eyes equal and reactive ENT/NECK:  Supple, no stridor  CARDIO:  normal rate, regular rhythm, appears well-perfused  PULM:  No respiratory distress, CTAB GI/GU:  non-distended,  MSK/SPINE:  No gross deformities, no edema, moves all extremities  SKIN:  no rash, atraumatic   *Additional and/or pertinent findings included in MDM below  Diagnostic and Interventional Summary    EKG Interpretation Date/Time:    Ventricular Rate:    PR Interval:    QRS Duration:    QT Interval:    QTC Calculation:   R Axis:      Text Interpretation:         Labs Reviewed  URINALYSIS, ROUTINE W REFLEX MICROSCOPIC - Abnormal; Notable for the following components:      Result Value   Specific Gravity, Urine 1.032 (*)    Ketones, ur 20 (*)    All other components within normal limits  COMPREHENSIVE METABOLIC PANEL - Abnormal; Notable for the following components:   CO2 20 (*)    All other components within normal limits  CBC  LIPASE, BLOOD  CBG MONITORING, ED     No orders to display    Medications  ketorolac (TORADOL) 30 MG/ML injection 30 mg (30 mg Intravenous Patient Refused/Not Given 02/02/23 0205)  metoCLOPramide (REGLAN) injection 10 mg (10 mg Intravenous Patient Refused/Not Given 02/02/23 0157)  diphenhydrAMINE (BENADRYL) injection 25 mg (25 mg Intravenous Patient Refused/Not Given 02/02/23 0156)  acetaminophen (TYLENOL) tablet 650 mg (650 mg Oral Given 02/01/23 2126)  sodium chloride 0.9 % bolus 1,000 mL (1,000 mLs Intravenous New Bag/Given 02/02/23 0205)     Procedures  /  Critical Care Procedures  ED Course and Medical Decision Making  I have reviewed the triage vital signs, the nursing notes, and pertinent available records from the EMR.  Social Determinants Affecting Complexity of Care: Patient has no clinically significant social determinants affecting this chief complaint..   ED Course:    Medical Decision Making Patient here with frequent headaches.  Similar to priors.  Refused headache cocktail.  CTs from prior visits reviewed.  Has followup with neurology.  I don't think he needs any further emergent workup or intervention at this time.  Amount and/or Complexity of Data Reviewed Labs: ordered.  Risk Prescription drug management.         Consultants: No consultations were needed in caring for this patient.   Treatment and Plan: Emergency department workup does not  suggest an emergent condition requiring admission or immediate intervention beyond  what has been performed at this time. The patient is safe for discharge and has  been instructed to return immediately for worsening symptoms, change in  symptoms or any other concerns    Final Clinical Impressions(s) / ED Diagnoses     ICD-10-CM   1. Frequent headaches  R51.9       ED Discharge Orders     None         Discharge Instructions Discussed with and Provided to Patient:   Discharge Instructions   None      Roxy Horseman, PA-C 02/02/23  0217    Shon Baton, MD 02/02/23 [phone]

## 2023-02-10 ENCOUNTER — Emergency Department (HOSPITAL_BASED_OUTPATIENT_CLINIC_OR_DEPARTMENT_OTHER)
Admission: EM | Admit: 2023-02-10 | Discharge: 2023-02-10 | Disposition: A | Discharge status: Home / Self Care | Payer: BLUE CROSS/BLUE SHIELD | Attending: Emergency Medicine | Admitting: Emergency Medicine

## 2023-02-10 ENCOUNTER — Emergency Department (HOSPITAL_BASED_OUTPATIENT_CLINIC_OR_DEPARTMENT_OTHER): Payer: BLUE CROSS/BLUE SHIELD

## 2023-02-10 ENCOUNTER — Encounter (HOSPITAL_BASED_OUTPATIENT_CLINIC_OR_DEPARTMENT_OTHER): Payer: Self-pay | Admitting: Emergency Medicine

## 2023-02-10 ENCOUNTER — Other Ambulatory Visit: Payer: Self-pay

## 2023-02-10 DIAGNOSIS — R519 Headache, unspecified: Secondary | ICD-10-CM | POA: Insufficient documentation

## 2023-02-10 MED ORDER — PROCHLORPERAZINE EDISYLATE 10 MG/2ML IJ SOLN
10.0000 mg | Freq: Once | INTRAMUSCULAR | Status: AC
  Administered 2023-02-10: 10 mg via INTRAVENOUS
  Filled 2023-02-10: qty 2

## 2023-02-10 MED ORDER — DIPHENHYDRAMINE HCL 50 MG/ML IJ SOLN
25.0000 mg | Freq: Once | INTRAMUSCULAR | Status: AC
  Administered 2023-02-10: 25 mg via INTRAVENOUS
  Filled 2023-02-10: qty 1

## 2023-02-10 MED ORDER — DEXAMETHASONE SODIUM PHOSPHATE 10 MG/ML IJ SOLN
10.0000 mg | Freq: Once | INTRAMUSCULAR | Status: AC
  Administered 2023-02-10: 10 mg via INTRAVENOUS
  Filled 2023-02-10: qty 1

## 2023-02-10 NOTE — ED Notes (Signed)
RN reviewed discharge instructions with pt. Pt verbalized understanding and had no further questions. VSS upon discharge.

## 2023-02-10 NOTE — ED Provider Notes (Signed)
EMERGENCY DEPARTMENT AT [facility] Provider Note   CSN: [id number] Arrival date & time: 02/10/23  2010     History  Chief Complaint  Patient presents with   Headache   Dizziness    Brett Taylor is a 28 y.o. male past medical history significant for headaches presents today for ongoing worsening headache.  Patient states he has had the headache for couple days and has tried taking 600 mg of ibuprofen with no relief.  Patient also admits associated dizziness.  Patient states that he feels that this is different from his usual headaches and is worse and not in the same area.  Patient is scheduled to see neurology next month.  Patient denies nausea, vomiting, weakness, numbness, fever, chills, neck pain, shortness of breath, or chest pain.   Headache Associated symptoms: dizziness   Dizziness Associated symptoms: headaches        Home Medications Prior to Admission medications   Medication Sig Start Date End Date Taking? Authorizing Provider  hydrOXYzine (ATARAX) 25 MG tablet Take 1 tablet (25 mg total) by mouth every 6 (six) hours as needed for anxiety. 11/29/22   Kommor, Wyn Forster, MD  ibuprofen (ADVIL) 600 MG tablet Take 1 tablet (600 mg total) by mouth every 8 (eight) hours as needed for headache. 01/25/23 02/24/23  Terald Sleeper, MD  ondansetron (ZOFRAN-ODT) 4 MG disintegrating tablet Take 1 tablet (4 mg total) by mouth every 8 (eight) hours as needed for nausea or vomiting. 01/30/23   Smoot, Shawn Route, PA-C      Allergies    Patient has no known allergies.    Review of Systems   Review of Systems  Neurological:  Positive for dizziness and headaches.    Physical Exam Updated Vital Signs BP 135/85   Pulse 99   Temp 98.1 F (36.7 C) (Oral)   Resp 18   Ht 6\' 5"  (1.956 m)   Wt 113.4 kg   SpO2 99%   BMI 29.65 kg/m  Physical Exam Vitals and nursing note reviewed.  Constitutional:      General: He is not in acute distress.    Appearance: He is  well-developed.  HENT:     Head: Normocephalic and atraumatic.  Eyes:     Extraocular Movements: Extraocular movements intact.     Conjunctiva/sclera: Conjunctivae normal.     Pupils: Pupils are equal, round, and reactive to light.  Cardiovascular:     Rate and Rhythm: Normal rate and regular rhythm.     Heart sounds: Normal heart sounds. No murmur heard. Pulmonary:     Effort: Pulmonary effort is normal. No respiratory distress.     Breath sounds: Normal breath sounds.  Abdominal:     Palpations: Abdomen is soft.     Tenderness: There is no abdominal tenderness.  Musculoskeletal:        General: No swelling.     Cervical back: Neck supple.  Skin:    General: Skin is warm and dry.     Capillary Refill: Capillary refill takes less than 2 seconds.  Neurological:     Mental Status: He is alert.     GCS: GCS eye subscore is 4. GCS verbal subscore is 5. GCS motor subscore is 6.     Cranial Nerves: No cranial nerve deficit or facial asymmetry.     Sensory: No sensory deficit.     Motor: No weakness.  Psychiatric:        Mood and Affect: Mood normal.  ED Results / Procedures / Treatments   Labs (all labs ordered are listed, but only abnormal results are displayed) Labs Reviewed - No data to display  EKG None  Radiology CT Head Wo Contrast Result Date: 02/10/2023 CLINICAL DATA:  Headache, increasing frequency or severity EXAM: CT HEAD WITHOUT CONTRAST TECHNIQUE: Contiguous axial images were obtained from the base of the skull through the vertex without intravenous contrast. RADIATION DOSE REDUCTION: This exam was performed according to the departmental dose-optimization program which includes automated exposure control, adjustment of the mA and/or kV according to patient size and/or use of iterative reconstruction technique. COMPARISON:  CT head 01/25/2023. FINDINGS: Brain: No evidence of acute infarction, hemorrhage, hydrocephalus, extra-axial collection or mass lesion/mass  effect. Vascular: No hyperdense vessel. Skull: No acute fracture. Sinuses/Orbits: No acute finding. Other: No mastoid effusions. IMPRESSION: No evidence of acute intracranial abnormality. Electronically Signed   By: Feliberto Harts M.D.   On: 02/10/2023 23:00    Procedures Procedures    Medications Ordered in ED Medications  dexamethasone (DECADRON) injection 10 mg (10 mg Intravenous Given 02/10/23 2216)  prochlorperazine (COMPAZINE) injection 10 mg (10 mg Intravenous Given 02/10/23 2223)  diphenhydrAMINE (BENADRYL) injection 25 mg (25 mg Intravenous Given 02/10/23 2212)    ED Course/ Medical Decision Making/ A&P                                 Medical Decision Making  This patient presents to the ED with chief complaint(s) of headache with pertinent past medical history of headache which further complicates the presenting complaint. The complaint involves an extensive differential diagnosis and also carries with it a high risk of complications and morbidity.    The differential diagnosis includes headache, brain bleed  Additional history obtained: Additional history obtained from family Records reviewed Primary Care Documents  ED Course and Reassessment: Patient given migraine cocktail Patient sleeping comfortably upon reassessment  Independent visualization of imaging: - I independently visualized the following imaging with scope of interpretation limited to determining acute life threatening conditions related to emergency care: Head CT Noncon, which revealed no evidence of acute intracranial abnormality  Consultation: - Consulted or discussed management/test interpretation w/ external professional: None  Consideration for admission or further workup: Considered for mission further urticaria patient vital signs, physical exam, and imaging is reassuring patient symptoms likely due to headache.  Patient has upcoming appointment with neurology for further evaluation and treatment  for such.        Final Clinical Impression(s) / ED Diagnoses Final diagnoses:  Nonintractable headache, unspecified chronicity pattern, unspecified headache type    Rx / DC Orders ED Discharge Orders     None         Dolphus Jenny, PA-C 02/10/23 2312    Glyn Ade, MD 02/17/23 1454

## 2023-02-10 NOTE — Discharge Instructions (Signed)
You are seen for headache.  Please follow-up with neurology for further evaluation and treatment.  Thank you for letting us treat you today. After performing physical exam and reviewing your imaging, I feel you are safe to go home. Please follow up with your PCP in the next several days and provide them with your records from this visit. Return to the Emergency Room if pain becomes severe or symptoms worsen.

## 2023-02-10 NOTE — ED Triage Notes (Signed)
Pt presents to the ED today for ongoing worsening headache. Pt states he has been taking all his headache medications with no relief. Pt also reports dizziness.

## 2023-02-25 ENCOUNTER — Other Ambulatory Visit: Payer: Self-pay

## 2023-02-25 ENCOUNTER — Emergency Department (HOSPITAL_COMMUNITY)
Admission: EM | Admit: 2023-02-25 | Discharge: 2023-02-25 | Disposition: A | Discharge status: Home / Self Care | Payer: BLUE CROSS/BLUE SHIELD | Attending: Emergency Medicine | Admitting: Emergency Medicine

## 2023-02-25 ENCOUNTER — Encounter (HOSPITAL_COMMUNITY): Payer: Self-pay | Admitting: *Deleted

## 2023-02-25 DIAGNOSIS — R519 Headache, unspecified: Secondary | ICD-10-CM | POA: Diagnosis present

## 2023-02-25 MED ORDER — ACETAMINOPHEN 325 MG PO TABS
650.0000 mg | ORAL_TABLET | Freq: Once | ORAL | Status: AC
  Administered 2023-02-25: 650 mg via ORAL
  Filled 2023-02-25: qty 2

## 2023-02-25 NOTE — ED Triage Notes (Signed)
Pt c/o headache tonight, says he thinks it may be due to the panic attack he had earlier and that he may have lost some oxygen. He says that he does have headaches and due to see a neurologist next month. Reports vomiting 1 time tonight, but feels it may be related to hyperventilating.

## 2023-02-25 NOTE — Discharge Instructions (Addendum)
Evaluation today was overall reassuring.  Recommend that you do follow-up with neurology.  You can take Tylenol ibuprofen and recommend caffeine and good hydration at home for your headaches.  If your symptoms worsen in any way you can return to the ED for further evaluation.  I did submitted ambulatory referral to neurology for your headaches.

## 2023-02-25 NOTE — ED Provider Notes (Signed)
[place] EMERGENCY DEPARTMENT AT [facility] Provider Note   CSN: [id number] Arrival date & time: 02/25/23  0207     History  Chief Complaint  Patient presents with   Headache   HPI Brett Taylor is a 28 y.o. male with history of ADHD, anxiety and depression presenting for headache.  States it started late yesterday evening.  Pain is located generally over his head.  Denies any visual disturbance.  Denies fever or nuchal rigidity.  Also states that he may have had a panic attack in the waiting room.  States he has had multiple panic attacks with headaches and is planned to follow-up with a neurologist at the end of this month.  States that his headache at this time is completely resolved and he does not feel like he is panicking in this moment.  Denies chest pain shortness of breath.  Was given 650 mg of Tylenol on arrival.   Headache      Home Medications Prior to Admission medications   Medication Sig Start Date End Date Taking? Authorizing Provider  hydrOXYzine (ATARAX) 25 MG tablet Take 1 tablet (25 mg total) by mouth every 6 (six) hours as needed for anxiety. 11/29/22   Kommor, [place], MD  ondansetron (ZOFRAN-ODT) 4 MG disintegrating tablet Take 1 tablet (4 mg total) by mouth every 8 (eight) hours as needed for nausea or vomiting. 01/30/23   Smoot, Shawn Route, PA-C      Allergies    Patient has no known allergies.    Review of Systems   Review of Systems  Neurological:  Positive for headaches.    Physical Exam Updated Vital Signs BP 129/84 (BP Location: Right Arm)   Pulse 65   Temp 98.2 F (36.8 C) (Oral)   Resp 16   SpO2 100%  Physical Exam Vitals and nursing note reviewed.  HENT:     Head: Normocephalic and atraumatic.     Mouth/Throat:     Mouth: Mucous membranes are moist.  Eyes:     General:        Right eye: No discharge.        Left eye: No discharge.     Conjunctiva/sclera: Conjunctivae normal.  Cardiovascular:     Rate and Rhythm:  Normal rate and regular rhythm.     Pulses: Normal pulses.     Heart sounds: Normal heart sounds.  Pulmonary:     Effort: Pulmonary effort is normal.     Breath sounds: Normal breath sounds.  Abdominal:     General: Abdomen is flat.     Palpations: Abdomen is soft.  Skin:    General: Skin is warm and dry.  Neurological:     General: No focal deficit present.     Comments: GCS 15. Speech is goal oriented. No deficits appreciated to CN III-XII; symmetric eyebrow raise, no facial drooping, tongue midline. Patient has equal grip strength bilaterally with 5/5 strength against resistance in all major muscle groups bilaterally. Sensation to light touch intact. Patient moves extremities without ataxia. Normal finger-nose-finger. Patient ambulatory with steady gait.  Psychiatric:        Mood and Affect: Mood normal.     ED Results / Procedures / Treatments   Labs (all labs ordered are listed, but only abnormal results are displayed) Labs Reviewed - No data to display  EKG None  Radiology No results found.  Procedures Procedures    Medications Ordered in ED Medications  acetaminophen (TYLENOL) tablet 650 mg (650  mg Oral Given 02/25/23 0229)    ED Course/ Medical Decision Making/ A&P                                 Medical Decision Making Risk OTC drugs.   28 year old well-appearing male presenting for headache.  Exam was unremarkable.  On initial counter patient was virtually asymptomatic.  Suspect this is a benign form of a headache.  Considered meningitis but unlikely given lack of meningismal symptoms.  Doubt hypertensive emergency or stroke given reassuring blood pressure and no focal neurodeficits on exam with a reassuring head CT last month.  Suspect these headaches are likely related to his panic attacks in someway as they seem to be recurring together.  Advised him that I feel that he does have appropriate follow-up with neurology at the end of this month.  Also advised  him to establish care with PCP for management of his headaches and panic attacks moving forward.  Discussed return precautions.  Discharged in good condition.  Also at the patient's request, I submitted an ambulatory referral for neurology as he is hoping to be seen sooner than his February appointment.        Final Clinical Impression(s) / ED Diagnoses Final diagnoses:  Nonintractable headache, unspecified chronicity pattern, unspecified headache type    Rx / DC Orders ED Discharge Orders          Ordered    Ambulatory referral to Neurology       Comments: An appointment is requested in approximately: 1 week for recurrent headaches   02/25/23 0712              Gareth Eagle, PA-C 02/25/23 0714    Melene Plan, DO 02/25/23 [phone]

## 2023-02-25 NOTE — ED Triage Notes (Signed)
Pt arrives via [facility] from home. Per report, pt is complaining of ongoing headache he has a Hx of migraine usually occurs daily. He sees a neurologist for the same. Took ibuprofen for the same without relief. VSS. 142/80, hr 90, rr 18, 100% ra.

## 2023-02-25 NOTE — ED Provider Notes (Signed)
MSE was initiated and I personally evaluated the patient and placed orders (if any) at  2:20 AM on February 25, 2023.  The patient appears stable so that the remainder of the MSE may be completed by another provider.  28 year old male presents with bifrontal headache for the last 6 weeks.  He takes ibuprofen which gives temporary relief.  Today, ibuprofen was not giving him relief.  He is awake and alert and with a normal neurologic exam.  He has had 4 CT scans of his head since 10/07/2022, last CT was on 02/10/2023, had CT angiogram of head and neck on 01/30/2023.  No indication for imaging today.  I have ordered a dose of acetaminophen.   Dione Booze, MD 02/25/23 [phone]

## 2023-03-09 ENCOUNTER — Emergency Department (HOSPITAL_COMMUNITY)
Admission: EM | Admit: 2023-03-09 | Discharge: 2023-03-09 | Disposition: A | Discharge status: Home / Self Care | Payer: BLUE CROSS/BLUE SHIELD | Attending: Emergency Medicine | Admitting: Emergency Medicine

## 2023-03-09 ENCOUNTER — Encounter (HOSPITAL_COMMUNITY): Payer: Self-pay | Admitting: Emergency Medicine

## 2023-03-09 ENCOUNTER — Other Ambulatory Visit: Payer: Self-pay

## 2023-03-09 ENCOUNTER — Emergency Department (HOSPITAL_COMMUNITY): Payer: BLUE CROSS/BLUE SHIELD

## 2023-03-09 DIAGNOSIS — J101 Influenza due to other identified influenza virus with other respiratory manifestations: Secondary | ICD-10-CM | POA: Insufficient documentation

## 2023-03-09 DIAGNOSIS — J111 Influenza due to unidentified influenza virus with other respiratory manifestations: Secondary | ICD-10-CM

## 2023-03-09 DIAGNOSIS — R519 Headache, unspecified: Secondary | ICD-10-CM | POA: Diagnosis present

## 2023-03-09 LAB — CBC WITH DIFFERENTIAL/PLATELET
Abs Immature Granulocytes: 0 10*3/uL (ref 0.00–0.07)
Basophils Absolute: 0 10*3/uL (ref 0.0–0.1)
Basophils Relative: 0 %
Eosinophils Absolute: 0.1 10*3/uL (ref 0.0–0.5)
Eosinophils Relative: 2 %
HCT: 47.5 % (ref 39.0–52.0)
Hemoglobin: 16.3 g/dL (ref 13.0–17.0)
Lymphocytes Relative: 87 %
Lymphs Abs: 5.5 10*3/uL — ABNORMAL HIGH (ref 0.7–4.0)
MCH: 27.6 pg (ref 26.0–34.0)
MCHC: 34.3 g/dL (ref 30.0–36.0)
MCV: 80.5 fL (ref 80.0–100.0)
Monocytes Absolute: 0.2 10*3/uL (ref 0.1–1.0)
Monocytes Relative: 3 %
Neutro Abs: 0.5 10*3/uL — ABNORMAL LOW (ref 1.7–7.7)
Neutrophils Relative %: 8 %
Platelets: 316 10*3/uL (ref 150–400)
RBC: 5.9 MIL/uL — ABNORMAL HIGH (ref 4.22–5.81)
RDW: 11.9 % (ref 11.5–15.5)
WBC: 6.3 10*3/uL (ref 4.0–10.5)
nRBC: 0 % (ref 0.0–0.2)
nRBC: 0 /100{WBCs}

## 2023-03-09 LAB — COMPREHENSIVE METABOLIC PANEL
ALT: 13 U/L (ref 0–44)
AST: 26 U/L (ref 15–41)
Albumin: 4.3 g/dL (ref 3.5–5.0)
Alkaline Phosphatase: 50 U/L (ref 38–126)
Anion gap: 15 (ref 5–15)
BUN: 9 mg/dL (ref 6–20)
CO2: 22 mmol/L (ref 22–32)
Calcium: 9.6 mg/dL (ref 8.9–10.3)
Chloride: 102 mmol/L (ref 98–111)
Creatinine, Ser: 1.18 mg/dL (ref 0.61–1.24)
GFR, Estimated: >60 mL/min (ref >60)
Glucose, Bld: 138 mg/dL — ABNORMAL HIGH (ref 70–99)
Potassium: 3 mmol/L — ABNORMAL LOW (ref 3.5–5.1)
Sodium: 139 mmol/L (ref 135–145)
Total Bilirubin: 1.2 mg/dL (ref 0.0–1.2)
Total Protein: 7.9 g/dL (ref 6.5–8.1)

## 2023-03-09 LAB — RESP PANEL BY RT-PCR (RSV, FLU A&B, COVID)  RVPGX2
Influenza A by PCR: POSITIVE — AB
Influenza B by PCR: NEGATIVE
Resp Syncytial Virus by PCR: NEGATIVE
SARS Coronavirus 2 by RT PCR: NEGATIVE

## 2023-03-09 LAB — LIPASE, BLOOD: Lipase: 30 U/L (ref 11–51)

## 2023-03-09 MED ORDER — SODIUM CHLORIDE 0.9 % IV BOLUS
1000.0000 mL | Freq: Once | INTRAVENOUS | Status: AC
  Administered 2023-03-09: 1000 mL via INTRAVENOUS

## 2023-03-09 MED ORDER — KETOROLAC TROMETHAMINE 15 MG/ML IJ SOLN
15.0000 mg | Freq: Once | INTRAMUSCULAR | Status: AC
  Administered 2023-03-09: 15 mg via INTRAVENOUS
  Filled 2023-03-09: qty 1

## 2023-03-09 MED ORDER — POTASSIUM CHLORIDE CRYS ER 20 MEQ PO TBCR
40.0000 meq | EXTENDED_RELEASE_TABLET | Freq: Once | ORAL | Status: AC
  Administered 2023-03-09: 40 meq via ORAL
  Filled 2023-03-09: qty 2

## 2023-03-09 MED ORDER — ONDANSETRON HCL 4 MG/2ML IJ SOLN
4.0000 mg | Freq: Once | INTRAMUSCULAR | Status: AC
  Administered 2023-03-09: 4 mg via INTRAVENOUS
  Filled 2023-03-09: qty 2

## 2023-03-09 MED ORDER — METOCLOPRAMIDE HCL 5 MG/ML IJ SOLN
10.0000 mg | Freq: Once | INTRAMUSCULAR | Status: AC
  Administered 2023-03-09: 10 mg via INTRAVENOUS
  Filled 2023-03-09: qty 2

## 2023-03-09 MED ORDER — DIPHENHYDRAMINE HCL 50 MG/ML IJ SOLN
12.5000 mg | Freq: Once | INTRAMUSCULAR | Status: AC
  Administered 2023-03-09: 12.5 mg via INTRAVENOUS
  Filled 2023-03-09: qty 1

## 2023-03-09 NOTE — ED Notes (Signed)
 Pt began hyperventilating in lobby, complaining of "burning up", sweating, and his muscles tensing. Vitals were rechecked, pulse rate in 140s. Triage RN notified, and after having come out to the lobby pt lurched forward out of the wheelchair. Pt put back into wheelchair with assistance and brought to triage.

## 2023-03-09 NOTE — ED Provider Notes (Signed)
 [place] EMERGENCY DEPARTMENT AT [facility] Provider Note   CSN: [id number] Arrival date & time: 03/09/23  0547     History  Chief Complaint  Patient presents with   Shortness of Breath   HPI Brett Taylor is a 28 y.o. male with ADHD, reported history of chronic headaches, anxiety presenting for shortness of breath and headache.  He also reports some nasal congestion, nausea and vomiting.  States the headache started this morning around 5 AM.  The pain is concentrated behind his eyes.  He does endorse sensitivity to light and sound.  Denies visual disturbance.  States he had a fever last week but no fever in the last few days.  Denies nuchal rigidity.  States the headache feels very similar to ones he is had in the past.  States he threw up once last night and once again this morning.  Denies any abdominal pain at this time.  Denies diarrhea or any bowel changes.  He also reports that he has a appointment with neurology for his chronic headaches.  Denies any chest pain.  States the shortness of breath has improved since his arrival.  Also mention that a family member recently had the flu.   Shortness of Breath      Home Medications Prior to Admission medications   Medication Sig Start Date End Date Taking? Authorizing Provider  hydrOXYzine (ATARAX) 25 MG tablet Take 1 tablet (25 mg total) by mouth every 6 (six) hours as needed for anxiety. 11/29/22   Kommor, Madison, MD  ondansetron (ZOFRAN-ODT) 4 MG disintegrating tablet Take 1 tablet (4 mg total) by mouth every 8 (eight) hours as needed for nausea or vomiting. 01/30/23   Smoot, Shawn Route, PA-C      Allergies    Patient has no known allergies.    Review of Systems   Review of Systems  Respiratory:  Positive for shortness of breath.     Physical Exam Updated Vital Signs BP 122/88   Pulse 80   Temp 98.4 F (36.9 C) (Oral)   Resp 18   Wt 113 kg   SpO2 100%   BMI 29.54 kg/m  Physical Exam Vitals and nursing  note reviewed.  Constitutional:      Comments: Negative Kernig's and Brudzinski's sign  HENT:     Head: Normocephalic and atraumatic.     Mouth/Throat:     Mouth: Mucous membranes are moist.  Eyes:     General:        Right eye: No discharge.        Left eye: No discharge.     Conjunctiva/sclera: Conjunctivae normal.  Cardiovascular:     Rate and Rhythm: Normal rate and regular rhythm.     Pulses: Normal pulses.     Heart sounds: Normal heart sounds.  Pulmonary:     Effort: Pulmonary effort is normal.     Breath sounds: Normal breath sounds.  Abdominal:     General: Abdomen is flat.     Palpations: Abdomen is soft.  Skin:    General: Skin is warm and dry.  Neurological:     General: No focal deficit present.     Comments: GCS 15. Speech is goal oriented. No deficits appreciated to CN III-XII; symmetric eyebrow raise, no facial drooping, tongue midline. Patient has equal grip strength bilaterally with 5/5 strength against resistance in all major muscle groups bilaterally. Sensation to light touch intact. Patient moves extremities without ataxia. Normal finger-nose-finger. Patient ambulatory with  steady gait.  Psychiatric:        Mood and Affect: Mood normal.     ED Results / Procedures / Treatments   Labs (all labs ordered are listed, but only abnormal results are displayed) Labs Reviewed  COMPREHENSIVE METABOLIC PANEL - Abnormal; Notable for the following components:      Result Value   Potassium 3.0 (*)    Glucose, Bld 138 (*)    All other components within normal limits  CBC WITH DIFFERENTIAL/PLATELET - Abnormal; Notable for the following components:   RBC 5.90 (*)    Neutro Abs 0.5 (*)    Lymphs Abs 5.5 (*)    All other components within normal limits  RESP PANEL BY RT-PCR (RSV, FLU A&B, COVID)  RVPGX2  LIPASE, BLOOD    EKG None  Radiology DG Chest 2 View Result Date: 03/09/2023 CLINICAL DATA:  Shortness of breath, nausea and weakness. EXAM: CHEST - 2 VIEW  COMPARISON:  01/31/2023 FINDINGS: The heart size and mediastinal contours are within normal limits. Both lungs are clear. The visualized skeletal structures are unremarkable. IMPRESSION: No active cardiopulmonary disease. Electronically Signed   By: Signa Kell M.D.   On: 03/09/2023 06:29    Procedures Procedures    Medications Ordered in ED Medications  ondansetron (ZOFRAN) injection 4 mg (4 mg Intravenous Given 03/09/23 2956)    ED Course/ Medical Decision Making/ A&P Clinical Course as of 03/09/23 0958  Mon Mar 09, 2023  0925 BMI (Calculated): 29.54 [JR]    Clinical Course User Index [JR] Gareth Eagle, PA-C                                 Medical Decision Making Amount and/or Complexity of Data Reviewed Labs: ordered. Radiology: ordered.  Risk Prescription drug management.   Initial Impression and Ddx 28 yo well appearing male presenting for headache, shortness of breath and nasal congestion.  Exam was unremarkable.  He did not appear initially in any respiratory distress.  He was resting comfortably in bed with a normal respiratory rate and O2 sats were 100% at that time.  DDx includes stroke, pneumonia, hypertensive emergency, meningitis, other. Patient PMH that increases complexity of ED encounter: history of chronic headaches, anxiety  Interpretation of Diagnostics - I independent reviewed and interpreted the labs as followed: flu +, hypokalemia  - I independently visualized the following imaging with scope of interpretation limited to determining acute life threatening conditions related to emergency care: CXR, which revealed no acute findings   Patient Reassessment and Ultimate Disposition/Management On reassessment after headache cocktail and fluid resuscitation, patient stated that he was feeling much better.  He was still resting comfortably in his bed.  Suspect his symptoms are likely related to having the flu and he is also likely having a migraine.   Considered meningitis but unlikely given no fever, no nuchal rigidity or visual disturbance and is well-appearing.  Advised supportive treatment at home.  Discussed follow-up with his PCP and advised him to go to his appointment with neurology for his chronic headaches.  Discharged in good condition.  Patient management required discussion with the following services or consulting groups:  None  Complexity of Problems Addressed Acute complicated illness or Injury  Additional Data Reviewed and Analyzed Further history obtained from: Further history from spouse/family member and Past medical history and medications listed in the EMR  Patient Encounter Risk Assessment None  Final Clinical Impression(s) / ED Diagnoses Final diagnoses:  None    Rx / DC Orders ED Discharge Orders     None         Gareth Eagle, PA-C 03/09/23 1014    Margarita Grizzle, MD 03/09/23 1326

## 2023-03-09 NOTE — ED Triage Notes (Addendum)
 Presents from home for SOB, frontal HA (has neuro appt Friday for chronic HA) Called EMS previously in night for anxiety, declined transport at that time Mother had flu 1 week ago. Endorses N/V, runny nose, HA, SOB Denies fever  EMS VS: 130(improved to 111bpm en route), 99% RA, 126palp SBP, no meds en route

## 2023-03-09 NOTE — ED Provider Triage Note (Signed)
 Emergency Medicine Provider Triage Evaluation Note  Brett Taylor , a 28 y.o. male  was evaluated in triage.  Pt complains of vomiting.  Patient states that he had flulike symptoms last week and he was doing well up until today he had onset of vomiting.  He complains of abdominal pain.  He states that he thinks his shortness of breath is because he is so nauseated.  He is actively retching.  He denies marijuana abuse.  He denies ingestion of suspicious foods.  Review of Systems  Positive: Vomiting Negative: For  Physical Exam  BP (!) 135/96 (BP Location: Right Arm)   Pulse (!) 118   Temp 98.7 F (37.1 C) (Oral)   Resp 16   Wt 113 kg   SpO2 100%   BMI 29.54 kg/m  Gen:   Awake, no distress   Resp:  Normal effort  MSK:   Moves extremities without difficulty  Other:  Benign abdominal exam, actively retching  Medical Decision Making  Medically screening exam initiated at 6:11 AM.  Appropriate orders placed.  RED MANDT was informed that the remainder of the evaluation will be completed by another provider, this initial triage assessment does not replace that evaluation, and the importance of remaining in the ED until their evaluation is complete.     Arthor Captain, PA-C 03/09/23 [phone]

## 2023-03-09 NOTE — Discharge Instructions (Addendum)
 Evaluation today revealed that you have the flu and you may also be having a migraine as well.  The treatment at home is supportive.  This includes rest and hydration with Gatorade and water and maintaining a balanced diet.  Recommend you follow-up with your PCP.  Also recommend you follow-up with neurology for your headaches.  If you develop neck stiffness, a fever, visual disturbance or any other concerning symptom please return emerged part further evaluation.

## 2023-03-13 ENCOUNTER — Ambulatory Visit (INDEPENDENT_AMBULATORY_CARE_PROVIDER_SITE_OTHER): Payer: BLUE CROSS/BLUE SHIELD | Admitting: Neurology

## 2023-03-13 ENCOUNTER — Encounter: Payer: Self-pay | Admitting: Neurology

## 2023-03-13 VITALS — BP 138/100 | HR 116 | Ht 77.0 in | Wt 223.6 lb

## 2023-03-13 DIAGNOSIS — G43709 Chronic migraine without aura, not intractable, without status migrainosus: Secondary | ICD-10-CM | POA: Diagnosis not present

## 2023-03-13 DIAGNOSIS — F419 Anxiety disorder, unspecified: Secondary | ICD-10-CM

## 2023-03-13 MED ORDER — DIVALPROEX SODIUM ER 500 MG PO TB24: 500.0000 mg | ORAL_TABLET | Freq: Every evening | ORAL | 11 refills | Status: AC

## 2023-03-13 MED ORDER — RIZATRIPTAN BENZOATE 10 MG PO TBDP: ORAL_TABLET | ORAL | 6 refills | Status: AC

## 2023-03-13 NOTE — Progress Notes (Signed)
 Chief Complaint  Patient presents with   Room 15    Pt is here with his Aunt. Pt was recently in the hospital 03/09/2023. Pt has a family history of Migraines and Vertigo. Pt states that when he has nausea. Pt states he has light sensitivity. Pt states that he is having hair loss where his migraines are.       ASSESSMENT AND PLAN  Brett Taylor is a 28 y.o. male   Significant treated anxiety Chronic migraine headache  Depakote ER at night as migraine prevention  Maxalt as needed Continue follow-up with his primary care and psychiatrist, only return to clinic for new issues   DIAGNOSTIC DATA (LABS, IMAGING, TESTING) - I reviewed patient records, labs, notes, testing and imaging myself where available.   MEDICAL HISTORY:  Brett Taylor is 28 year old male accompanied by his aunt, seen in request by his primary care [facility] for evaluation of chronic migraine,  History is obtained from the patient and review of electronic medical records. I personally reviewed pertinent available imaging films in PACS.   PMHx of  Anxiety, hydroxyzine every 6 hours since Nov 2024,   He had a significant anxiety since 2023 for all his 2 years in consternation from 21-2023, at the same time, he developed frequent headaches, holoacranial severe pounding headache with light noise sensitivity, lasting 30 to 60 minutes,  He presented to the emergency room on February 07, 2023 for shortness of breath, headache, with nausea vomiting,  February 07, 2023, CT head, CT angiogram of head and neck was normal  Patient have significant anxiety, could not sit still during today's visit, only taking hydroxyzine as needed, BuSpar,  PHYSICAL EXAM:   Vitals:   03/13/23 0902  BP: (!) 138/100  Pulse: (!) 116  Weight: 223 lb 9.6 oz (101.4 kg)  Height: 6\' 5"  (1.956 m)   Not recorded     Body mass index is 26.52 kg/m.  PHYSICAL EXAMNIATION:  Gen: NAD, conversant, well nourised, well groomed                      Cardiovascular: Regular rate rhythm, no peripheral edema, warm, nontender. Eyes: Conjunctivae clear without exudates or hemorrhage Neck: Supple, no carotid bruits. Pulmonary: Clear to auscultation bilaterally   NEUROLOGICAL EXAM:  MENTAL STATUS: Speech/cognition: Awake, alert, oriented to history taking and casual conversation CRANIAL NERVES: CN II: Visual fields are full to confrontation. Pupils are round equal and briskly reactive to light. CN III, IV, VI: extraocular movement are normal. No ptosis. CN V: Facial sensation is intact to light touch CN VII: Face is symmetric with normal eye closure  CN VIII: Hearing is normal to causal conversation. CN IX, X: Phonation is normal. CN XI: Head turning and shoulder shrug are intact  MOTOR: There is no pronator drift of out-stretched arms. Muscle bulk and tone are normal. Muscle strength is normal.  REFLEXES: Reflexes are 2+ and symmetric at the biceps, triceps, knees, and ankles. Plantar responses are flexor.  SENSORY: Intact to light touch, pinprick and vibratory sensation are intact in fingers and toes.  COORDINATION: There is no trunk or limb dysmetria noted.  GAIT/STANCE: Posture is normal. Gait is steady    REVIEW OF SYSTEMS:  Full 14 system review of systems performed and notable only for as above All other review of systems were negative.   ALLERGIES: No Known Allergies  HOME MEDICATIONS: Current Outpatient Medications  Medication Sig Dispense Refill   busPIRone (  BUSPAR) 10 MG tablet Take 10 mg by mouth 2 (two) times daily.     hydrOXYzine (ATARAX) 25 MG tablet Take 1 tablet (25 mg total) by mouth every 6 (six) hours as needed for anxiety. 30 tablet 0   gabapentin (NEURONTIN) 100 MG capsule Take 100 mg by mouth 3 (three) times daily. (Patient not taking: Reported on 03/13/2023)     ondansetron (ZOFRAN-ODT) 4 MG disintegrating tablet Take 1 tablet (4 mg total) by mouth every 8 (eight) hours as  needed for nausea or vomiting. (Patient not taking: Reported on 03/13/2023) 20 tablet 0   traZODone (DESYREL) 50 MG tablet Take 25-50 mg by mouth at bedtime. (Patient not taking: Reported on 03/13/2023)     No current facility-administered medications for this visit.    PAST MEDICAL HISTORY: Past Medical History:  Diagnosis Date   ADHD (attention deficit hyperactivity disorder)    Anxiety    Anxiety    Depression    History of heart murmur in childhood    Wears glasses     PAST SURGICAL HISTORY: Past Surgical History:  Procedure Laterality Date   NO PAST SURGERIES     TONSILLECTOMY      FAMILY HISTORY: Family History  Problem Relation Age of Onset   Hypertension Mother    Cancer Maternal Grandmother        breast   Heart disease Maternal Grandfather    Diabetes Other        maternal side   Stroke Neg Hx     SOCIAL HISTORY: Social History   Socioeconomic History   Marital status: Single    Spouse name: Not on file   Number of children: Not on file   Years of education: 12   Highest education level: Not on file  Occupational History   Not on file  Tobacco Use   Smoking status: Former    Current packs/day: 0.25    Average packs/day: 0.3 packs/day for 4.0 years (1.0 ttl pk-yrs)    Types: Cigarettes   Smokeless tobacco: Never   Tobacco comments:    03/25/2016 "smoked a black right before I came in"  Vaping Use   Vaping status: Never Used  Substance and Sexual Activity   Alcohol use: Not Currently   Drug use: Not Currently    Types: Marijuana   Sexual activity: Yes  Other Topics Concern   Not on file  Social History Narrative   Lives with mother and sister, 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  [employer] school for high school.   No college currently.   Exercise -active on the job, lifts boxes up to 50lb, lots of walking, goes to the gym 3-4 times per week.  No current relatioship   Social Drivers of Corporate investment banker  Strain: Not on file  Food Insecurity: Not on file  Transportation Needs: Not on file  Physical Activity: Not on file  Stress: Not on file  Social Connections: Not on file  Intimate Partner Violence: Not on file      Levert Feinstein, M.D. Ph.D.  [facility] [address] [place], [place] [address] Ph: [phone] Fax: [phone]  CC:  Shon Baton, MD [address] [place],  [place] [address]  [facility]

## 2023-03-16 ENCOUNTER — Emergency Department (HOSPITAL_COMMUNITY)

## 2023-03-16 ENCOUNTER — Emergency Department (HOSPITAL_COMMUNITY)
Admission: EM | Admit: 2023-03-16 | Discharge: 2023-03-16 | Disposition: A | Discharge status: Home / Self Care | Attending: Emergency Medicine | Admitting: Emergency Medicine

## 2023-03-16 ENCOUNTER — Encounter (HOSPITAL_COMMUNITY): Payer: Self-pay | Admitting: Emergency Medicine

## 2023-03-16 DIAGNOSIS — R072 Precordial pain: Secondary | ICD-10-CM | POA: Insufficient documentation

## 2023-03-16 DIAGNOSIS — R079 Chest pain, unspecified: Secondary | ICD-10-CM | POA: Diagnosis present

## 2023-03-16 LAB — BASIC METABOLIC PANEL
Anion gap: 10 (ref 5–15)
BUN: 10 mg/dL (ref 6–20)
CO2: 22 mmol/L (ref 22–32)
Calcium: 9.7 mg/dL (ref 8.9–10.3)
Chloride: 109 mmol/L (ref 98–111)
Creatinine, Ser: 0.97 mg/dL (ref 0.61–1.24)
GFR, Estimated: >60 mL/min (ref >60)
Glucose, Bld: 93 mg/dL (ref 70–99)
Potassium: 3.4 mmol/L — ABNORMAL LOW (ref 3.5–5.1)
Sodium: 141 mmol/L (ref 135–145)

## 2023-03-16 LAB — CBC
HCT: 41.2 % (ref 39.0–52.0)
Hemoglobin: 13.7 g/dL (ref 13.0–17.0)
MCH: 27.5 pg (ref 26.0–34.0)
MCHC: 33.3 g/dL (ref 30.0–36.0)
MCV: 82.7 fL (ref 80.0–100.0)
Platelets: 325 10*3/uL (ref 150–400)
RBC: 4.98 MIL/uL (ref 4.22–5.81)
RDW: 13.1 % (ref 11.5–15.5)
WBC: 4.3 10*3/uL (ref 4.0–10.5)
nRBC: 0 % (ref 0.0–0.2)

## 2023-03-16 LAB — TROPONIN I (HIGH SENSITIVITY)
Troponin I (High Sensitivity): 4 ng/L (ref <18)
Troponin I (High Sensitivity): 4 ng/L (ref <18)

## 2023-03-16 NOTE — Discharge Instructions (Signed)
You were seen in the emergency department today for chest pain.  As we discussed your lab work, EKG, chest x-ray all looked reassuring today.    I recommend monitoring your stress levels.  Continue to monitor how you are doing overall, and return to the emergency department for any new or worsening symptoms such as: Worsening pain or pain with exertion, difficulty breathing, sweating, or pain or swelling in your legs.

## 2023-03-16 NOTE — ED Provider Notes (Signed)
 [place] EMERGENCY DEPARTMENT AT [facility] Provider Note   CSN: [id number] Arrival date & time: 03/16/23  0720     History  Chief Complaint  Patient presents with   Chest Pain    Brett Taylor is a 28 y.o. male.  Patient with history of chronic migraines presents today with complaints of chest pain. He states that same began at 3:00 this morning and lasted a few hours before spontaneously resolving.  States he is currently symptom-free.  Pain was left-sided in nature and felt like pressure.  Nothing made it better or worse.  Denies ever having any shortness of breath.  No leg pain or leg swelling.  No PND or orthopnea.  No history of similar symptoms previously.  No cardiac history.  He does not smoke cigarettes and denies any recreational or IVDU.  No fevers or chills.  No nausea or vomiting.  The history is provided by the patient. No language interpreter was used.  Chest Pain      Home Medications Prior to Admission medications   Medication Sig Start Date End Date Taking? Authorizing Provider  busPIRone (BUSPAR) 10 MG tablet Take 10 mg by mouth 2 (two) times daily. 03/02/23  Yes [provider]  divalproex (DEPAKOTE ER) 500 MG 24 hr tablet Take 1 tablet (500 mg total) by mouth at bedtime. 03/13/23  Yes Levert Feinstein, MD  hydrOXYzine (ATARAX) 25 MG tablet Take 1 tablet (25 mg total) by mouth every 6 (six) hours as needed for anxiety. 11/29/22  Yes Kommor, Madison, MD  ondansetron (ZOFRAN-ODT) 4 MG disintegrating tablet Take 1 tablet (4 mg total) by mouth every 8 (eight) hours as needed for nausea or vomiting. 01/30/23  Yes Bali Lyn, Shawn Route, PA-C  rizatriptan (MAXALT-MLT) 10 MG disintegrating tablet May repeat in 2 hours if needed 03/13/23  Yes Levert Feinstein, MD  gabapentin (NEURONTIN) 100 MG capsule Take 100 mg by mouth 3 (three) times daily. Patient not taking: Reported on 03/13/2023 03/02/23   [provider]  traZODone (DESYREL) 50 MG tablet Take 25-50 mg by  mouth at bedtime. Patient not taking: Reported on 03/13/2023 03/02/23   [provider]      Allergies    Patient has no known allergies.    Review of Systems   Review of Systems  Cardiovascular:  Positive for chest pain.  All other systems reviewed and are negative.   Physical Exam Updated Vital Signs BP 122/70   Pulse (!) 55   Temp 98.3 F (36.8 C)   Resp 18   SpO2 100%  Physical Exam Vitals and nursing note reviewed.  Constitutional:      General: He is not in acute distress.    Appearance: Normal appearance. He is normal weight. He is not ill-appearing, toxic-appearing or diaphoretic.  HENT:     Head: Normocephalic and atraumatic.  Cardiovascular:     Rate and Rhythm: Normal rate and regular rhythm.     Pulses:          Dorsalis pedis pulses are 2+ on the right side and 2+ on the left side.       Posterior tibial pulses are 2+ on the right side and 2+ on the left side.     Heart sounds: Normal heart sounds.  Pulmonary:     Effort: Pulmonary effort is normal. No respiratory distress.     Breath sounds: Normal breath sounds.  Abdominal:     Palpations: Abdomen is soft.  Tenderness: There is no abdominal tenderness.  Musculoskeletal:        General: Normal range of motion.     Cervical back: Normal range of motion.     Right lower leg: No tenderness. No edema.     Left lower leg: No tenderness. No edema.  Skin:    General: Skin is warm and dry.  Neurological:     General: No focal deficit present.     Mental Status: He is alert.  Psychiatric:        Mood and Affect: Mood normal.        Behavior: Behavior normal.     ED Results / Procedures / Treatments   Labs (all labs ordered are listed, but only abnormal results are displayed) Labs Reviewed  BASIC METABOLIC PANEL - Abnormal; Notable for the following components:      Result Value   Potassium 3.4 (*)    All other components within normal limits  CBC  TROPONIN I (HIGH SENSITIVITY)  TROPONIN  I (HIGH SENSITIVITY)    EKG None  Radiology DG Chest 2 View Result Date: 03/16/2023 CLINICAL DATA:  Chest pain. EXAM: CHEST - 2 VIEW COMPARISON:  03/09/2023 FINDINGS: The lungs are clear without focal pneumonia, edema, pneumothorax or pleural effusion. The cardiopericardial silhouette is within normal limits for size. No acute bony abnormality. Telemetry leads overlie the chest. IMPRESSION: No active cardiopulmonary disease. Electronically Signed   By: Kennith Center M.D.   On: 03/16/2023 10:24    Procedures Procedures    Medications Ordered in ED Medications - No data to display  ED Course/ Medical Decision Making/ A&P             HEART Score: 0                    Medical Decision Making Amount and/or Complexity of Data Reviewed Labs: ordered. Radiology: ordered.   This patient is a 28 y.o. male who presents to the ED for concern of chest pain, this involves an extensive number of treatment options, and is a complaint that carries with it a high risk of complications and morbidity. The emergent differential diagnosis prior to evaluation includes, but is not limited to,  ACS, pericarditis, myocarditis, aortic dissection, PE, pneumothorax, esophageal rupture, pneumonia, reflux/PUD, biliary disease, pancreatitis, costochondritis, anxiety    This is not an exhaustive differential.   Past Medical History / Co-morbidities / Social History:  has a past medical history of ADHD (attention deficit hyperactivity disorder), Anxiety, Anxiety, Depression, History of heart murmur in childhood, and Wears glasses.  Additional history: Chart reviewed. Pertinent results include: seen several times for headaches previously, has been seen previously for chest pain and has not had a evaluation.  Physical Exam: Physical exam performed. The pertinent findings include: Physical exam without acute abnormalities.  Patient asymptomatic on my exam.  Lab Tests: I ordered, and personally interpreted labs.   The pertinent results include:  K 3.4, troponin 4 --> 4   Imaging Studies: I ordered imaging studies including CXR. I independently visualized and interpreted imaging which showed NAD. I agree with the radiologist interpretation.   Cardiac Monitoring:  The patient was maintained on a cardiac monitor.  Cardiac monitor showed an underlying rhythm of: no STEMI. I agree with this interpretation. Disposition: After consideration of the diagnostic results and the patients response to treatment, I feel that emergency department workup does not suggest an emergent condition requiring admission or immediate intervention beyond what has been performed  at this time. The plan is: Discharge with close outpatient follow-up and return precautions.  Patient's heart score is 0, he is PERC negative, and he has been asymptomatic throughout his stay here today.  He is also low risk age category with no comorbid conditions. Evaluation and diagnostic testing in the emergency department does not suggest an emergent condition requiring admission or immediate intervention beyond what has been performed at this time.  Plan for discharge with close PCP follow-up.  Patient is understanding and amenable with plan, educated on red flag symptoms that would prompt immediate return.  Patient discharged in stable condition.  Final Clinical Impression(s) / ED Diagnoses Final diagnoses:  Precordial chest pain    Rx / DC Orders ED Discharge Orders     None     An After Visit Summary was printed and given to the patient.     Vear Clock 03/16/23 1256    Margarita Grizzle, MD 03/16/23 [phone]

## 2023-03-16 NOTE — ED Triage Notes (Signed)
 Pt BIB by EMS for mid-sternal CP since 0300 this morning. Denies movement or radiation. Denies [facility] or cardiac hx. Recent visit for flu-like symptoms.

## 2023-03-16 NOTE — ED Notes (Signed)
Pt verbalized understanding of discharge instructions. Pt ambulated from ed with steady gait.  

## 2023-03-26 ENCOUNTER — Ambulatory Visit: Payer: Self-pay | Admitting: Family Medicine

## 2023-03-26 NOTE — Telephone Encounter (Signed)
 Copied from CRM [id number]. Topic: Clinical - Red Word Triage >> Mar 26, 2023  2:17 PM Turkey B wrote: [facility] that prompted transfer to Nurse Triage: pt has anxiety attacks Reason for Disposition  MODERATE anxiety (e.g., persistent or frequent anxiety symptoms; interferes with sleep, school, or work)  Answer Assessment - Initial Assessment Questions 1. CONCERN: "Did anything happen that prompted you to call today?"      Brett Taylor, grandmother calling in. He is having anxiety attacks every day.  Sometimes it at day time and also at night.   He has them every day.   He has been in and out of the hospital and urgent care.   He talks with the crisis line.   He takes medicine for anxiety.   He does not have a primary doctor.    I need to get him established with a new PCP.   2. ANXIETY SYMPTOMS: "Can you describe how you (your loved one; patient) have been feeling?" (e.g., tense, restless, panicky, anxious, keyed up, overwhelmed, sense of impending doom).      He has a Veterinary surgeon at [employer] that prescribed his medicines.    3. ONSET: "How long have you been feeling this way?" (e.g., hours, days, weeks)     Every day 4. SEVERITY: "How would you rate the level of anxiety?" (e.g., 0 - 10; or mild, moderate, severe).     moderate 5. FUNCTIONAL IMPAIRMENT: "How have these feelings affected your ability to do daily activities?" "Have you had more difficulty than usual doing your normal daily activities?" (e.g., getting better, same, worse; self-care, school, work, interactions)     They are getitng worse.    Affects him every night 6. HISTORY: "Have you felt this way before?" "Have you ever been diagnosed with an anxiety problem in the past?" (e.g., generalized anxiety disorder, panic attacks, PTSD). If Yes, ask: "How was this problem treated?" (e.g., medicines, counseling, etc.)     He has been seeing a Veterinary surgeon at [facility]. 7. RISK OF HARM - SUICIDAL IDEATION: "Do you ever have thoughts of hurting or  killing yourself?" If Yes, ask:  "Do you have these feelings now?" "Do you have a plan on how you would do this?"     No 8. TREATMENT:  "What has been done so far to treat this anxiety?" (e.g., medicines, relaxation strategies). "What has helped?"     He is seeing a Veterinary surgeon at [employer] 9. TREATMENT - THERAPIST: "Do you have a counselor or therapist? Name?"     Yes 10. POTENTIAL TRIGGERS: "Do you drink caffeinated beverages (e.g., coffee, colas, teas), and how much daily?" "Do you drink alcohol or use any drugs?" "Have you started any new medicines recently?"       Not asked 11. PATIENT SUPPORT: "Who is with you now?" "Who do you live with?" "Do you have family or friends who you can talk to?"        grandmother 18. OTHER SYMPTOMS: "Do you have any other symptoms?" (e.g., feeling depressed, trouble concentrating, trouble sleeping, trouble breathing, palpitations or fast heartbeat, chest pain, sweating, nausea, or diarrhea)       Trouble sleeping 13. PREGNANCY: "Is there any chance you are pregnant?" "When was your last menstrual period?"       N/A  Protocols used: Anxiety and Panic Attack-A-AH  Chief Complaint: Grandmother calling in, Brett Taylor wanting to get him established with a new PCP.   He was transferred to nurse triage because she is  wanting to get him seen for anxiety.    I warm transferred the call into [facility] for scheduling because the provider listed on their insurance is no longer accepting new pts.     Symptoms: Anxiety daily Frequency: daily Pertinent Negatives: Patient denies having a PCP and wanting to establish Disposition: [] ED /[] Urgent Care (no appt availability in office) / [x] Appointment(In office/virtual)/ []  [facility]/ [] Home Care/ [] Refused Recommended Disposition /[] [facility]/ []  Follow-up with PCP Additional Notes: Call warm transferred to the practice for scheduling.

## 2023-04-08 ENCOUNTER — Ambulatory Visit: Admitting: Nurse Practitioner

## 2023-11-20 ENCOUNTER — Emergency Department (HOSPITAL_COMMUNITY)
Admission: EM | Admit: 2023-11-20 | Discharge: 2023-11-20 | Disposition: A | Discharge status: Home / Self Care | Attending: Emergency Medicine | Admitting: Emergency Medicine

## 2023-11-20 DIAGNOSIS — N179 Acute kidney failure, unspecified: Secondary | ICD-10-CM | POA: Diagnosis not present

## 2023-11-20 DIAGNOSIS — R590 Localized enlarged lymph nodes: Secondary | ICD-10-CM | POA: Insufficient documentation

## 2023-11-20 DIAGNOSIS — B349 Viral infection, unspecified: Secondary | ICD-10-CM | POA: Diagnosis not present

## 2023-11-20 DIAGNOSIS — J029 Acute pharyngitis, unspecified: Secondary | ICD-10-CM | POA: Diagnosis not present

## 2023-11-20 DIAGNOSIS — R059 Cough, unspecified: Secondary | ICD-10-CM | POA: Diagnosis present

## 2023-11-20 LAB — CBC WITH DIFFERENTIAL/PLATELET
Abs Immature Granulocytes: 0.04 K/uL (ref 0.00–0.07)
Basophils Absolute: 0 K/uL (ref 0.0–0.1)
Basophils Relative: 0 %
Eosinophils Absolute: 0.1 K/uL (ref 0.0–0.5)
Eosinophils Relative: 1 %
HCT: 46.3 % (ref 39.0–52.0)
Hemoglobin: 15.2 g/dL (ref 13.0–17.0)
Immature Granulocytes: 0 %
Lymphocytes Relative: 8 %
Lymphs Abs: 0.8 K/uL (ref 0.7–4.0)
MCH: 27.5 pg (ref 26.0–34.0)
MCHC: 32.8 g/dL (ref 30.0–36.0)
MCV: 83.9 fL (ref 80.0–100.0)
Monocytes Absolute: 0.8 K/uL (ref 0.1–1.0)
Monocytes Relative: 7 %
Neutro Abs: 9.1 K/uL — ABNORMAL HIGH (ref 1.7–7.7)
Neutrophils Relative %: 84 %
Platelets: 293 K/uL (ref 150–400)
RBC: 5.52 MIL/uL (ref 4.22–5.81)
RDW: 12.2 % (ref 11.5–15.5)
WBC: 10.9 K/uL — ABNORMAL HIGH (ref 4.0–10.5)
nRBC: 0 % (ref 0.0–0.2)

## 2023-11-20 LAB — COMPREHENSIVE METABOLIC PANEL WITH GFR
ALT: 23 U/L (ref 0–44)
AST: 22 U/L (ref 15–41)
Albumin: 4.5 g/dL (ref 3.5–5.0)
Alkaline Phosphatase: 67 U/L (ref 38–126)
Anion gap: 12 (ref 5–15)
BUN: 10 mg/dL (ref 6–20)
CO2: 22 mmol/L (ref 22–32)
Calcium: 10.1 mg/dL (ref 8.9–10.3)
Chloride: 105 mmol/L (ref 98–111)
Creatinine, Ser: 1.27 mg/dL — ABNORMAL HIGH (ref 0.61–1.24)
GFR, Estimated: >60 mL/min (ref >60)
Glucose, Bld: 128 mg/dL — ABNORMAL HIGH (ref 70–99)
Potassium: 3.8 mmol/L (ref 3.5–5.1)
Sodium: 138 mmol/L (ref 135–145)
Total Bilirubin: 0.7 mg/dL (ref 0.0–1.2)
Total Protein: 8.2 g/dL — ABNORMAL HIGH (ref 6.5–8.1)

## 2023-11-20 LAB — RESP PANEL BY RT-PCR (RSV, FLU A&B, COVID)  RVPGX2
Influenza A by PCR: NEGATIVE
Influenza B by PCR: NEGATIVE
Resp Syncytial Virus by PCR: NEGATIVE
SARS Coronavirus 2 by RT PCR: NEGATIVE

## 2023-11-20 MED ORDER — SODIUM CHLORIDE 0.9 % IV BOLUS
1000.0000 mL | Freq: Once | INTRAVENOUS | Status: AC
  Administered 2023-11-20: 1000 mL via INTRAVENOUS

## 2023-11-20 MED ORDER — ONDANSETRON HCL 4 MG/2ML IJ SOLN
4.0000 mg | Freq: Once | INTRAMUSCULAR | Status: AC
Start: 2023-11-20 — End: 2023-11-20
  Administered 2023-11-20: 4 mg via INTRAVENOUS
  Filled 2023-11-20: qty 2

## 2023-11-20 MED ORDER — IBUPROFEN 200 MG PO TABS
400.0000 mg | ORAL_TABLET | Freq: Once | ORAL | Status: AC
  Administered 2023-11-20: 400 mg via ORAL
  Filled 2023-11-20: qty 2

## 2023-11-20 MED ORDER — ONDANSETRON HCL 4 MG PO TABS: 4.0000 mg | ORAL_TABLET | Freq: Four times a day (QID) | ORAL | 0 refills | Status: AC

## 2023-11-20 MED ORDER — DEXAMETHASONE 1 MG/ML PO CONC
10.0000 mg | Freq: Once | ORAL | Status: AC
  Administered 2023-11-20: 10 mg via ORAL
  Filled 2023-11-20: qty 10

## 2023-11-20 MED ORDER — ACETAMINOPHEN 325 MG PO TABS
650.0000 mg | ORAL_TABLET | Freq: Once | ORAL | Status: AC
  Administered 2023-11-20: 650 mg via ORAL
  Filled 2023-11-20: qty 2

## 2023-11-20 NOTE — ED Notes (Signed)
 Patient complaining of headache and rates pain 5/10. Provider made aware.

## 2023-11-20 NOTE — ED Triage Notes (Signed)
 BIB EMS from home for ST and body aches for four days. Taking tylenol  without relief.

## 2023-11-20 NOTE — ED Provider Notes (Signed)
 [facility] EMERGENCY DEPARTMENT AT [facility] Provider Note   CSN: [id number] Arrival date & time: 11/20/23  1243     Patient presents with: Sore Throat   Brett Taylor is a 28 y.o. male.   Patient is a 28 year old male with past medical history of depression, anxiety and PTSD presenting to the emergency department with bodyaches and sore throat.  Patient states he has been feeling sick for the last 4 days.  States he has associated cough and congestion, nausea, vomiting and diarrhea.  He states that he has been unable to keep much liquid down due to his nausea and vomiting.  He states that he started to feel lightheaded yesterday and passed out once at home and again here in the emergency department.  He states that his girlfriend is sick with similar symptoms.  He states that he has generalized abdominal discomfort.  The history is provided by the patient and a relative.  Sore Throat       Prior to Admission medications   Medication Sig Start Date End Date Taking? Authorizing Provider  ondansetron  (ZOFRAN ) 4 MG tablet Take 1 tablet (4 mg total) by mouth every 6 (six) hours. 11/20/23  Yes Ellouise, Irais Mottram K, DO  busPIRone (BUSPAR) 10 MG tablet Take 10 mg by mouth 2 (two) times daily. 03/02/23   [provider]  divalproex  (DEPAKOTE  ER) 500 MG 24 hr tablet Take 1 tablet (500 mg total) by mouth at bedtime. 03/13/23   Onita Duos, MD  gabapentin (NEURONTIN) 100 MG capsule Take 100 mg by mouth 3 (three) times daily. Patient not taking: Reported on 03/13/2023 03/02/23   [provider]  hydrOXYzine  (ATARAX ) 25 MG tablet Take 1 tablet (25 mg total) by mouth every 6 (six) hours as needed for anxiety. 11/29/22   Kommor, Madison, MD  ondansetron  (ZOFRAN -ODT) 4 MG disintegrating tablet Take 1 tablet (4 mg total) by mouth every 8 (eight) hours as needed for nausea or vomiting. 01/30/23   Smoot, Lauraine LABOR, PA-C  rizatriptan  (MAXALT -MLT) 10 MG disintegrating tablet May  repeat in 2 hours if needed 03/13/23   Onita Duos, MD  traZODone (DESYREL) 50 MG tablet Take 25-50 mg by mouth at bedtime. Patient not taking: Reported on 03/13/2023 03/02/23   [provider]    Allergies: Patient has no known allergies.    Review of Systems  Updated Vital Signs BP (!) 110/58 (BP Location: Right Arm)   Pulse (!) 106   Temp (!) 100.5 F (38.1 C) (Oral)   Resp 16   SpO2 100%   Physical Exam Vitals and nursing note reviewed.  Constitutional:      General: He is not in acute distress.    Appearance: He is well-developed. He is ill-appearing.  HENT:     Head: Normocephalic and atraumatic.     Nose: Congestion present.     Mouth/Throat:     Mouth: Mucous membranes are moist.     Pharynx: Uvula midline. Posterior oropharyngeal erythema present. No pharyngeal swelling, oropharyngeal exudate or uvula swelling.     Tonsils: No tonsillar exudate or tonsillar abscesses. 0 on the right. 0 on the left.  Eyes:     Conjunctiva/sclera: Conjunctivae normal.  Cardiovascular:     Rate and Rhythm: Normal rate and regular rhythm.     Heart sounds: Normal heart sounds.  Pulmonary:     Effort: Pulmonary effort is normal.     Breath sounds: Normal breath sounds.  Abdominal:  Palpations: Abdomen is soft.     Tenderness: There is abdominal tenderness (Mild, diffuse).  Musculoskeletal:     Cervical back: Normal range of motion and neck supple.  Lymphadenopathy:     Cervical: Cervical adenopathy present.  Skin:    General: Skin is warm and dry.  Neurological:     General: No focal deficit present.     Mental Status: He is alert and oriented to person, place, and time.  Psychiatric:        Mood and Affect: Mood normal.        Behavior: Behavior normal.     (all labs ordered are listed, but only abnormal results are displayed) Labs Reviewed  CBC WITH DIFFERENTIAL/PLATELET - Abnormal; Notable for the following components:      Result Value   WBC 10.9 (*)     Neutro Abs 9.1 (*)    All other components within normal limits  COMPREHENSIVE METABOLIC PANEL WITH GFR - Abnormal; Notable for the following components:   Glucose, Bld 128 (*)    Creatinine, Ser 1.27 (*)    Total Protein 8.2 (*)    All other components within normal limits  RESP PANEL BY RT-PCR (RSV, FLU A&B, COVID)  [id number]    EKG: EKG Interpretation Date/Time:  Friday November 20 2023 13:57:41 EST Ventricular Rate:  109 PR Interval:  157 QRS Duration:  79 QT Interval:  296 QTC Calculation: 399 R Axis:   73  Text Interpretation: Sinus tachycardia Probable anteroseptal infarct, old Confirmed by Ellouise Fine ([id number]) on 11/20/2023 2:01:01 PM  Radiology: No results found.   Procedures   Medications Ordered in the ED  sodium chloride  0.9 % bolus 1,000 mL (0 mLs Intravenous Stopped 11/20/23 1444)  ondansetron  (ZOFRAN ) injection 4 mg (4 mg Intravenous Given 11/20/23 1351)  ibuprofen  (ADVIL ) tablet 400 mg (400 mg Oral Given 11/20/23 1351)  dexamethasone  (DECADRON ) 1 MG/ML solution 10 mg (10 mg Oral Given 11/20/23 1404)  acetaminophen  (TYLENOL ) tablet 650 mg (650 mg Oral Given 11/20/23 1454)    Clinical Course as of 11/20/23 1519  Fri Nov 20, 2023  1432 Mild AKI, otherwise labs within normal range. [VK]  1447 HR improving, did spike a fever and will give Tylenol . Patient tolerating PO fluids. [VK]    Clinical Course User Index [VK] [name], [name], DO                                 Medical Decision Making This patient presents to the ED with chief complaint(s) of sore throat, body aches, dizziness with pertinent past medical history of anxiety, depression, PTSD which further complicates the presenting complaint. The complaint involves an extensive differential diagnosis and also carries with it a high risk of complications and morbidity.    The differential diagnosis includes viral syndrome, no tonsillar swelling or exudates and he is low risk by Centor score making strep  throat unlikely, considering pneumonia, dehydration, electrolyte abnormality, arrhythmia, anemia  Additional history obtained: Additional history obtained from family Records reviewed N/A  ED Course and Reassessment: On patient's arrival he is tachycardic but otherwise hemodynamically stable in no acute distress.  When he was attempting to get up from a wheelchair into the seat in the room he did become lightheaded and passed out.  He is unsure if he hit his head, no head injury.  The patient had viral swab performed in triage.  With his tachycardia and syncope  will additionally have labs and EKG will be started on fluids as well as symptomatic management and will be closely reassessed.  Independent labs interpretation:  The following labs were independently interpreted: AKI otherwise within normal range  Independent visualization of imaging: -N/A  Consultation: - Consulted or discussed management/test interpretation w/ external professional: N/A  Consideration for admission or further workup: Patient has no emergent conditions requiring admission or further work-up at this time and is stable for discharge home with primary care follow-up  Social Determinants of health: N/A    Amount and/or Complexity of Data Reviewed Labs: ordered.  Risk OTC drugs. Prescription drug management.       Final diagnoses:  AKI (acute kidney injury)  Viral syndrome    ED Discharge Orders          Ordered    ondansetron  (ZOFRAN ) 4 MG tablet  Every 6 hours        11/20/23 1518               [name], Luvenia Cranford K, DO 11/20/23 1519

## 2023-11-20 NOTE — Discharge Instructions (Signed)
 You were seen in the emergency department for your sore throat, vomiting and diarrhea.  You were dehydrated with a kidney injury and we gave you fluids in the emergency department.  You tested negative for COVID, flu and RSV and likely have another viral infection.  You can take Tylenol  and Motrin  every 6 hours as needed for fever or pain and you should follow-up with your primary doctor within the next week to have your kidney function rechecked.  You should return to the emergency department if you are having repetitive vomiting despite the nausea medicine, significantly worsening pain or any other new or concerning symptoms.
